# Patient Record
Sex: Female | Born: 1995 | Hispanic: Yes | Marital: Single | Smoking: Never smoker
Health system: Southern US, Community
[De-identification: ages and names within clinical notes are randomized; demographics above are authoritative.]

## PROBLEM LIST (undated history)

## (undated) ENCOUNTER — Inpatient Hospital Stay (HOSPITAL_COMMUNITY): Payer: Self-pay

## (undated) ENCOUNTER — Inpatient Hospital Stay: Payer: Self-pay

## (undated) DIAGNOSIS — A749 Chlamydial infection, unspecified: Secondary | ICD-10-CM

## (undated) DIAGNOSIS — Z789 Other specified health status: Secondary | ICD-10-CM

## (undated) DIAGNOSIS — A638 Other specified predominantly sexually transmitted diseases: Secondary | ICD-10-CM

## (undated) HISTORY — PX: NO PAST SURGERIES: SHX2092

## (undated) HISTORY — DX: Other specified health status: Z78.9

---
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
## 2016-01-02 ENCOUNTER — Encounter: Payer: Self-pay | Admitting: Family Medicine

## 2016-01-06 ENCOUNTER — Ambulatory Visit: Payer: Medicaid Other | Admitting: Family Medicine

## 2016-02-05 ENCOUNTER — Encounter: Payer: Self-pay | Admitting: Family Medicine

## 2016-02-11 ENCOUNTER — Encounter: Payer: Self-pay | Admitting: Family Medicine

## 2016-02-11 ENCOUNTER — Ambulatory Visit (INDEPENDENT_AMBULATORY_CARE_PROVIDER_SITE_OTHER): Payer: Medicaid Other | Admitting: Family Medicine

## 2016-02-11 VITALS — BP 118/70 | HR 68 | Temp 98.6°F | Ht 61.1 in | Wt 103.0 lb

## 2016-02-11 DIAGNOSIS — Z23 Encounter for immunization: Secondary | ICD-10-CM | POA: Diagnosis not present

## 2016-02-11 DIAGNOSIS — R300 Dysuria: Secondary | ICD-10-CM | POA: Diagnosis not present

## 2016-02-11 DIAGNOSIS — Z113 Encounter for screening for infections with a predominantly sexual mode of transmission: Secondary | ICD-10-CM | POA: Diagnosis not present

## 2016-02-11 DIAGNOSIS — N926 Irregular menstruation, unspecified: Secondary | ICD-10-CM

## 2016-02-11 DIAGNOSIS — N3001 Acute cystitis with hematuria: Secondary | ICD-10-CM | POA: Diagnosis not present

## 2016-02-11 MED ORDER — CIPROFLOXACIN HCL 500 MG PO TABS: 500.0000 mg | ORAL_TABLET | Freq: Two times a day (BID) | ORAL | Status: DC

## 2016-02-11 NOTE — Progress Notes (Signed)
BP 118/70 mmHg  Pulse 68  Temp(Src) 98.6 F (37 C)  Ht 5' 1.1" (1.552 m)  Wt 103 lb (46.72 kg)  BMI 19.40 kg/m2  SpO2 100%  LMP 01/18/2016 (Exact Date)  Breastfeeding? No   Subjective:    Patient ID: Carly Singleton, female    DOB: 01/04/1996, 20 y.o.   MRN: [id number]  HPI: Carly Singleton is a 20 y.o. female  Chief Complaint  Patient presents with  . vaginal issues    Patient states that she is having multiple symptoms, she is having symptoms of pregnany enough though she has the nexplanon, her period was two weeks last month, She is also having a burning sensation in the vaginal area.   Concerned that she is having pregnancy symptoms vs nexaplanon side effects. Has had the nexplanon for about a month now.  Period lasted 2 weeks  Has been hungry at times Has not been hungry at times Has been having pain in her back and her hip Nauseous Would like pregnancy test  URINARY SYMPTOMS Duration: 2 weeks Dysuria: burning Urinary frequency: yes Urgency: yes Small volume voids: no Symptom severity: mild Urinary incontinence: no Foul odor: no Hematuria: no Abdominal pain: no Back pain: yes Suprapubic pain/pressure: no Flank pain: yes Fever:  no Vomiting: no Status: worse Previous urinary tract infection: yes Recurrent urinary tract infection: no Sexual activity: monogamous History of sexually transmitted disease: no Vaginal discharge: no Treatments attempted: none   Relevant past medical, surgical, family and social history reviewed and updated as indicated. Interim medical history since our last visit reviewed. Allergies and medications reviewed and updated.  Review of Systems  Constitutional: Negative.   Respiratory: Negative.   Cardiovascular: Negative.   Gastrointestinal: Negative.   Genitourinary: Positive for dysuria, urgency, frequency and flank pain. Negative for hematuria, decreased urine volume, vaginal bleeding, vaginal discharge, enuresis,  difficulty urinating, genital sores, vaginal pain, menstrual problem, pelvic pain and dyspareunia.  Psychiatric/Behavioral: Negative.      Per HPI unless specifically indicated above     Objective:    BP 118/70 mmHg  Pulse 68  Temp(Src) 98.6 F (37 C)  Ht 5' 1.1" (1.552 m)  Wt 103 lb (46.72 kg)  BMI 19.40 kg/m2  SpO2 100%  LMP 01/18/2016 (Exact Date)  Breastfeeding? No  Wt Readings from Last 3 Encounters:  02/11/16 103 lb (46.72 kg) (8 %*, Z = -1.39)  12/31/15 101 lb 8 oz (46.04 kg) (7 %*, Z = -1.51)  12/26/15 99 lb 9.6 oz (45.178 kg) (5 %*, Z = -1.68)   * Growth percentiles are based on CDC 2-20 Years data.    Physical Exam  Constitutional: She is oriented to person, place, and time. She appears well-developed and well-nourished. No distress.  HENT:  Head: Normocephalic and atraumatic.  Right Ear: Hearing normal.  Left Ear: Hearing normal.  Nose: Nose normal.  Eyes: Conjunctivae and lids are normal. Right eye exhibits no discharge. Left eye exhibits no discharge. No scleral icterus.  Cardiovascular: Normal rate, regular rhythm, normal heart sounds and intact distal pulses.  Exam reveals no gallop and no friction rub.   No murmur heard. Pulmonary/Chest: Effort normal and breath sounds normal. No respiratory distress. She has no wheezes. She has no rales. She exhibits no tenderness.  Abdominal: Soft. Bowel sounds are normal. She exhibits no distension and no mass. There is no tenderness. There is CVA tenderness. There is no rebound and no guarding.  Musculoskeletal: Normal range of motion.  Neurological: She  is alert and oriented to person, place, and time.  Skin: Skin is warm, dry and intact. No rash noted. No erythema. No pallor.  Psychiatric: She has a normal mood and affect. Her speech is normal and behavior is normal. Judgment and thought content normal. Cognition and memory are normal.  Nursing note and vitals reviewed.   Results for orders placed or performed in  visit on 12/31/15  POCT urine pregnancy  Result Value Ref Range   Preg Test, Ur Negative Negative      Assessment & Plan:   Problem List Items Addressed This Visit    None    Visit Diagnoses    Acute cystitis with hematuria    -  Primary    Will start cipro. Possibly causing all her symptoms. Not breastfeeding. Call with any concerns or complaints.     Abnormal menstrual periods        Likely due to nexplanon- urine pregnancy negative.     Relevant Orders    UA/M w/rflx Culture, Routine    Pregnancy, urine    Dysuria        UA checked today, + UTI    Relevant Orders    UA/M w/rflx Culture, Routine    Pregnancy, urine    Routine screening for STI (sexually transmitted infection)        Labs checked today. Await results.     Relevant Orders    HIV antibody    Immunization due        Meningitis shot given today.    Relevant Orders    Meningococcal polysaccharide vaccine subcutaneous        Follow up plan: Return After 02/29/16 for physical.

## 2016-02-11 NOTE — Patient Instructions (Signed)

## 2016-02-12 ENCOUNTER — Encounter: Payer: Self-pay | Admitting: Family Medicine

## 2016-02-12 LAB — HIV ANTIBODY (ROUTINE TESTING W REFLEX): HIV Screen 4th Generation wRfx: NONREACTIVE

## 2016-02-13 LAB — MICROSCOPIC EXAMINATION

## 2016-02-13 LAB — UA/M W/RFLX CULTURE, ROUTINE
BILIRUBIN UA: NEGATIVE
GLUCOSE, UA: NEGATIVE
KETONES UA: NEGATIVE
Nitrite, UA: NEGATIVE
Protein, UA: NEGATIVE
SPEC GRAV UA: 1.015 (ref 1.005–1.030)
UUROB: 0.2 mg/dL (ref 0.2–1.0)
pH, UA: 5 (ref 5.0–7.5)

## 2016-02-13 LAB — PREGNANCY, URINE: PREG TEST UR: NEGATIVE

## 2016-02-13 LAB — URINE CULTURE, REFLEX

## 2016-03-02 ENCOUNTER — Encounter: Payer: Medicaid Other | Admitting: Family Medicine

## 2016-03-02 ENCOUNTER — Encounter: Payer: Self-pay | Admitting: Family Medicine

## 2016-03-02 NOTE — Progress Notes (Signed)
History and recommendations reviewed, agree with assessment and plan as outlined  M.Raechel Marcos. MD 

## 2016-04-14 ENCOUNTER — Ambulatory Visit (INDEPENDENT_AMBULATORY_CARE_PROVIDER_SITE_OTHER): Payer: Medicaid Other | Admitting: Family Medicine

## 2016-04-14 ENCOUNTER — Encounter: Payer: Self-pay | Admitting: Family Medicine

## 2016-04-14 VITALS — BP 92/49 | HR 73 | Temp 98.7°F | Ht 61.0 in | Wt 102.0 lb

## 2016-04-14 DIAGNOSIS — M545 Low back pain: Secondary | ICD-10-CM

## 2016-04-14 MED ORDER — CIPROFLOXACIN HCL 250 MG PO TABS: 250.0000 mg | ORAL_TABLET | Freq: Two times a day (BID) | ORAL | Status: DC

## 2016-04-14 NOTE — Progress Notes (Signed)
BP 92/49 mmHg  Pulse 73  Temp(Src) 98.7 F (37.1 C)  Ht 5\' 1"  (1.549 m)  Wt 102 lb (46.267 kg)  BMI 19.28 kg/m2  SpO2 100%  LMP 03/23/2016 (Approximate)  Breastfeeding? Unknown   Subjective:    Patient ID: Carly Singleton, female    DOB: January 09, 1996, 20 y.o.   MRN: [id number]  HPI: Carly Singleton is a 20 y.o. female  Chief Complaint  Patient presents with  . Back Pain    Patient did not take her antibiotics that were given for a UTI   Patient was here with UTI 2 months ago. Mixed flora on her culture. Did not take the antibiotics for the UTI after last visit. Here today with back pain.  Back has been hurting for about a month. Low back, both BACK PAIN Duration: 1 month Mechanism of injury: unknown Location: bilateral and low back Onset: gradual Severity: moderate Quality: sharp, dull, aching and cramping Frequency: intermittent Radiation: none Aggravating factors: none Alleviating factors: nothing Status: stable Treatments attempted: rest, ice, heat, APAP, ibuprofen and aleve  Relief with NSAIDs?: mild Nighttime pain:  no Paresthesias / decreased sensation:  no Bowel / bladder incontinence:  no Fevers:  no Dysuria / urinary frequency:  no  Relevant past medical, surgical, family and social history reviewed and updated as indicated. Interim medical history since our last visit reviewed. Allergies and medications reviewed and updated.  Review of Systems  Constitutional: Negative.   Respiratory: Negative.   Cardiovascular: Negative.   Gastrointestinal: Negative.   Musculoskeletal: Positive for back pain. Negative for myalgias, joint swelling, arthralgias, gait problem, neck pain and neck stiffness.  Skin: Negative.   Psychiatric/Behavioral: Negative.     Per HPI unless specifically indicated above     Objective:    BP 92/49 mmHg  Pulse 73  Temp(Src) 98.7 F (37.1 C)  Ht 5\' 1"  (1.549 m)  Wt 102 lb (46.267 kg)  BMI 19.28 kg/m2  SpO2 100%   LMP 03/23/2016 (Approximate)  Breastfeeding? Unknown  Wt Readings from Last 3 Encounters:  04/14/16 102 lb (46.267 kg) (7 %*, Z = -1.50)  02/11/16 103 lb (46.72 kg) (8 %*, Z = -1.39)  12/31/15 101 lb 8 oz (46.04 kg) (7 %*, Z = -1.51)   * Growth percentiles are based on CDC 2-20 Years data.    Physical Exam  Constitutional: She is oriented to person, place, and time. She appears well-developed and well-nourished. No distress.  HENT:  Head: Normocephalic and atraumatic.  Right Ear: Hearing normal.  Left Ear: Hearing normal.  Nose: Nose normal.  Eyes: Conjunctivae and lids are normal. Right eye exhibits no discharge. Left eye exhibits no discharge. No scleral icterus.  Cardiovascular: Normal rate, regular rhythm, normal heart sounds and intact distal pulses.  Exam reveals no gallop and no friction rub.   No murmur heard. Pulmonary/Chest: Effort normal and breath sounds normal. No respiratory distress. She has no wheezes. She has no rales. She exhibits no tenderness.  Neurological: She is alert and oriented to person, place, and time.  Skin: Skin is warm, dry and intact. No rash noted. No erythema. No pallor.  Psychiatric: She has a normal mood and affect. Her speech is normal and behavior is normal. Judgment and thought content normal. Cognition and memory are normal.  Nursing note and vitals reviewed. Back Exam:    Inspection:  Normal spinal curvature.  No deformity, ecchymosis, erythema, or lesions     Palpation:     Midline spinal  tenderness: no      Paralumbar tenderness: no      Parathoracic tenderness: no      Buttocks tenderness: no     Range of Motion:      Flexion: Normal     Extension:Normal     Lateral bending:Normal    Rotation:Normal    Neuro Exam:Lower extremity DTRs normal & symmetric.  Strength and sensation intact.    Special Tests:      Straight leg raise:negative  Results for orders placed or performed in visit on 02/11/16  Microscopic Examination  Result  Value Ref Range   WBC, UA 11-30 (A) 0 -  5 /hpf   RBC, UA 0-2 0 -  2 /hpf   Epithelial Cells (non renal) >10 (A) 0 - 10 /hpf   Bacteria, UA Few None seen/Few  HIV antibody  Result Value Ref Range   HIV Screen 4th Generation wRfx Non Reactive Non Reactive  UA/M w/rflx Culture, Routine  Result Value Ref Range   Specific Gravity, UA 1.015 1.005 - 1.030   pH, UA 5.0 5.0 - 7.5   Color, UA Yellow Yellow   Appearance Ur Cloudy (A) Clear   Leukocytes, UA 2+ (A) Negative   Protein, UA Negative Negative/Trace   Glucose, UA Negative Negative   Ketones, UA Negative Negative   RBC, UA Trace (A) Negative   Bilirubin, UA Negative Negative   Urobilinogen, Ur 0.2 0.2 - 1.0 mg/dL   Nitrite, UA Negative Negative   Microscopic Examination See below:    Urinalysis Reflex Comment   Pregnancy, urine  Result Value Ref Range   Preg Test, Ur Negative Negative  Urine Culture, Routine  Result Value Ref Range   Urine Culture, Routine Final report    Urine Culture result 1 Comment       Assessment & Plan:   Problem List Items Addressed This Visit    None    Visit Diagnoses    Low back pain without sciatica, unspecified back pain laterality    -  Primary    Appears to have UTI. Will treat. Will call her on Friday to see if better, if not consider muscular cause.    Relevant Orders    UA/M w/rflx Culture, Routine        Follow up plan: Return if symptoms worsen or fail to improve.

## 2016-04-16 LAB — MICROSCOPIC EXAMINATION: Epithelial Cells (non renal): >10 /hpf — AB (ref 0–10)

## 2016-04-16 LAB — UA/M W/RFLX CULTURE, ROUTINE
Bilirubin, UA: NEGATIVE
Glucose, UA: NEGATIVE
Ketones, UA: NEGATIVE
NITRITE UA: NEGATIVE
PH UA: 5.5 (ref 5.0–7.5)
Protein, UA: NEGATIVE
Specific Gravity, UA: >1.03 — ABNORMAL HIGH (ref 1.005–1.030)
UUROB: 0.2 mg/dL (ref 0.2–1.0)

## 2016-04-16 LAB — URINE CULTURE, REFLEX

## 2016-04-20 ENCOUNTER — Telehealth: Payer: Self-pay | Admitting: Family Medicine

## 2016-04-20 NOTE — Telephone Encounter (Signed)
Called to check in on Yesinia's back. She was not home. Mom will have her call when she gets home.

## 2016-04-20 NOTE — Telephone Encounter (Signed)
-----   Message from Dorcas CarrowMegan P Johnson, DO sent at 04/14/2016  2:25 PM EDT ----- Call Carly CampusYesenia about her back

## 2016-05-01 ENCOUNTER — Encounter: Payer: Self-pay | Admitting: Family Medicine

## 2016-05-01 NOTE — Telephone Encounter (Signed)
Unable to get in touch by phone. Letter generated and mailed to patient.

## 2016-08-10 ENCOUNTER — Emergency Department
Admission: EM | Admit: 2016-08-10 | Discharge: 2016-08-10 | Disposition: A | Discharge status: Home / Self Care | Payer: No Typology Code available for payment source | Attending: Student in an Organized Health Care Education/Training Program | Admitting: Student in an Organized Health Care Education/Training Program

## 2016-08-10 ENCOUNTER — Emergency Department: Payer: No Typology Code available for payment source

## 2016-08-10 ENCOUNTER — Encounter: Payer: Self-pay | Admitting: Medical Oncology

## 2016-08-10 DIAGNOSIS — Y999 Unspecified external cause status: Secondary | ICD-10-CM | POA: Diagnosis not present

## 2016-08-10 DIAGNOSIS — Y92410 Unspecified street and highway as the place of occurrence of the external cause: Secondary | ICD-10-CM | POA: Insufficient documentation

## 2016-08-10 DIAGNOSIS — Y9389 Activity, other specified: Secondary | ICD-10-CM | POA: Insufficient documentation

## 2016-08-10 DIAGNOSIS — M545 Low back pain, unspecified: Secondary | ICD-10-CM

## 2016-08-10 LAB — POCT PREGNANCY, URINE: Preg Test, Ur: NEGATIVE

## 2016-08-10 MED ORDER — BACLOFEN 10 MG PO TABS: 10.0000 mg | ORAL_TABLET | Freq: Three times a day (TID) | ORAL | 0 refills | Status: DC

## 2016-08-10 MED ORDER — NAPROXEN 500 MG PO TABS: 500.0000 mg | ORAL_TABLET | Freq: Two times a day (BID) | ORAL | 0 refills | Status: DC

## 2016-08-10 NOTE — Discharge Instructions (Signed)
Follow-up with your primary care provider for symptoms that are not improving over the week. Return to emergency department for symptoms that change or worsen if unable to schedule an appointment.
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
## 2016-10-06 ENCOUNTER — Ambulatory Visit: Payer: Medicaid Other | Admitting: Family Medicine

## 2016-10-07 ENCOUNTER — Other Ambulatory Visit: Payer: Self-pay | Admitting: Family Medicine

## 2016-10-07 ENCOUNTER — Ambulatory Visit: Payer: Medicaid Other | Admitting: Family Medicine

## 2016-10-07 DIAGNOSIS — Z113 Encounter for screening for infections with a predominantly sexual mode of transmission: Secondary | ICD-10-CM

## 2016-10-12 ENCOUNTER — Ambulatory Visit: Payer: Medicaid Other | Admitting: Family Medicine

## 2016-10-23 ENCOUNTER — Ambulatory Visit (INDEPENDENT_AMBULATORY_CARE_PROVIDER_SITE_OTHER): Payer: Medicaid Other | Admitting: Family Medicine

## 2016-10-23 ENCOUNTER — Encounter: Payer: Self-pay | Admitting: Family Medicine

## 2016-10-23 VITALS — BP 97/66 | HR 85 | Temp 98.5°F | Wt 99.0 lb

## 2016-10-23 DIAGNOSIS — F321 Major depressive disorder, single episode, moderate: Secondary | ICD-10-CM | POA: Diagnosis not present

## 2016-10-23 DIAGNOSIS — F331 Major depressive disorder, recurrent, moderate: Secondary | ICD-10-CM | POA: Insufficient documentation

## 2016-10-23 DIAGNOSIS — R634 Abnormal weight loss: Secondary | ICD-10-CM

## 2016-10-23 DIAGNOSIS — H538 Other visual disturbances: Secondary | ICD-10-CM | POA: Diagnosis not present

## 2016-10-23 DIAGNOSIS — Z23 Encounter for immunization: Secondary | ICD-10-CM | POA: Diagnosis not present

## 2016-10-23 HISTORY — DX: Major depressive disorder, recurrent, moderate: F33.1

## 2016-10-23 MED ORDER — SERTRALINE HCL 25 MG PO TABS: 25.0000 mg | ORAL_TABLET | Freq: Every day | ORAL | 2 refills | Status: DC

## 2016-10-23 NOTE — Assessment & Plan Note (Signed)
Will start her on zoloft. Risks and benefits discussed. Call with any concerns. Recheck 1 month.

## 2016-10-23 NOTE — Patient Instructions (Addendum)
[employer] instant [employer]  Influenza (Flu) Vaccine (Inactivated or Recombinant):  1. Why get vaccinated? Influenza ("flu") is a contagious disease that spreads around the [place] every year, usually between October and May. Flu is caused by influenza viruses, and is spread mainly by coughing, sneezing, and close contact. Anyone can get flu. Flu strikes suddenly and can last several days. Symptoms vary by age, but can include:  fever/chills  sore throat  muscle aches  fatigue  cough  headache  runny or stuffy nose Flu can also lead to pneumonia and blood infections, and cause diarrhea and seizures in children. If you have a medical condition, such as heart or lung disease, flu can make it worse. Flu is more dangerous for some people. Infants and young children, people 67 years of age and older, pregnant women, and people with certain health conditions or a weakened immune system are at greatest risk. Each year thousands of people in the [place] die from flu, and many more are hospitalized. Flu vaccine can:  keep you from getting flu,  make flu less severe if you do get it, and  keep you from spreading flu to your family and other people. 2. Inactivated and recombinant flu vaccines A dose of flu vaccine is recommended every flu season. Children 6 months through 57 years of age may need two doses during the same flu season. Everyone else needs only one dose each flu season. Some inactivated flu vaccines contain a very small amount of a mercury-based preservative called thimerosal. Studies have not shown thimerosal in vaccines to be harmful, but flu vaccines that do not contain thimerosal are available. There is no live flu virus in flu shots. They cannot cause the flu. There are many flu viruses, and they are always changing. Each year a new flu vaccine is made to protect against three or four viruses that are likely to cause disease in the upcoming flu season. But even  when the vaccine doesn't exactly match these viruses, it may still provide some protection. Flu vaccine cannot prevent:  flu that is caused by a virus not covered by the vaccine, or  illnesses that look like flu but are not. It takes about 2 weeks for protection to develop after vaccination, and protection lasts through the flu season. 3. Some people should not get this vaccine Tell the person who is giving you the vaccine:  If you have any severe, life-threatening allergies. If you ever had a life-threatening allergic reaction after a dose of flu vaccine, or have a severe allergy to any part of this vaccine, you may be advised not to get vaccinated. Most, but not all, types of flu vaccine contain a small amount of egg protein.  If you ever had Guillain-Barre Syndrome (also called GBS). Some people with a history of GBS should not get this vaccine. This should be discussed with your doctor.  If you are not feeling well. It is usually okay to get flu vaccine when you have a mild illness, but you might be asked to come back when you feel better. 4. Risks of a vaccine reaction With any medicine, including vaccines, there is a chance of reactions. These are usually mild and go away on their own, but serious reactions are also possible. Most people who get a flu shot do not have any problems with it. Minor problems following a flu shot include:  soreness, redness, or swelling where the shot was given  hoarseness  sore, red or itchy  eyes  cough  fever  aches  headache  itching  fatigue If these problems occur, they usually begin soon after the shot and last 1 or 2 days. More serious problems following a flu shot can include the following:  There may be a small increased risk of Guillain-Barre Syndrome (GBS) after inactivated flu vaccine. This risk has been estimated at 1 or 2 additional cases per million people vaccinated. This is much lower than the risk of severe complications  from flu, which can be prevented by flu vaccine.  Young children who get the flu shot along with pneumococcal vaccine (PCV13) and/or DTaP vaccine at the same time might be slightly more likely to have a seizure caused by fever. Ask your doctor for more information. Tell your doctor if a child who is getting flu vaccine has ever had a seizure. Problems that could happen after any injected vaccine:  People sometimes faint after a medical procedure, including vaccination. Sitting or lying down for about 15 minutes can help prevent fainting, and injuries caused by a fall. Tell your doctor if you feel dizzy, or have vision changes or ringing in the ears.  Some people get severe pain in the shoulder and have difficulty moving the arm where a shot was given. This happens very rarely.  Any medication can cause a severe allergic reaction. Such reactions from a vaccine are very rare, estimated at about 1 in a million doses, and would happen within a few minutes to a few hours after the vaccination. As with any medicine, there is a very remote chance of a vaccine causing a serious injury or death. The safety of vaccines is always being monitored. For more information, visit: http://www.aguilar.org/ 5. What if there is a serious reaction? What should I look for?  Look for anything that concerns you, such as signs of a severe allergic reaction, very high fever, or unusual behavior. Signs of a severe allergic reaction can include hives, swelling of the face and throat, difficulty breathing, a fast heartbeat, dizziness, and weakness. These would start a few minutes to a few hours after the vaccination. What should I do?  If you think it is a severe allergic reaction or other emergency that can't wait, call 9-1-1 and get the person to the nearest hospital. Otherwise, call your doctor.  Reactions should be reported to the Vaccine Adverse Event Reporting System (VAERS). Your doctor should file this report, or  you can do it yourself through the VAERS web site at www.vaers.SamedayNews.es, or by calling 850-339-5350. VAERS does not give medical advice. 6. The National Vaccine Injury Compensation Program The [employer] Vaccine Injury Compensation Program (VICP) is a federal program that was created to compensate people who may have been injured by certain vaccines. Persons who believe they may have been injured by a vaccine can learn about the program and about filing a claim by calling 702-402-8185 or visiting the [employer] website at GoldCloset.com.ee. There is a time limit to file a claim for compensation. 7. How can I learn more?  Ask your healthcare provider. He or she can give you the vaccine package insert or suggest other sources of information.  Call your local or state health department.  Contact the Centers for Disease Control and Prevention (CDC):  Call 406-232-6490 (1-800-CDC-INFO) or  Visit CDC's website at https://gibson.com/ Vaccine Information Statement Inactivated Influenza Vaccine (08/03/2014)   This information is not intended to replace advice given to you by your health care provider. Make sure you discuss any  questions you have with your health care provider.   Document Released: 10/08/2006 Document Revised: 01/04/2015 Document Reviewed: 08/06/2014 Elsevier Interactive Patient Education 2016 [place] and Stress Management Stress is a normal reaction to life events. It is what you feel when life demands more than you are used to or more than you can handle. Some stress can be useful. For example, the stress reaction can help you catch the last bus of the day, study for a test, or meet a deadline at work. But stress that occurs too often or for too long can cause problems. It can affect your emotional health and interfere with relationships and normal daily activities. Too much stress can weaken your immune system and increase your risk for physical illness. If you  already have a medical problem, stress can make it worse. CAUSES  All sorts of life events may cause stress. An event that causes stress for one person may not be stressful for another person. Major life events commonly cause stress. These may be positive or negative. Examples include losing your job, moving into a new home, getting married, having a baby, or losing a loved one. Less obvious life events may also cause stress, especially if they occur day after day or in combination. Examples include working long hours, driving in traffic, caring for children, being in debt, or being in a difficult relationship. SIGNS AND SYMPTOMS Stress may cause emotional symptoms including, the following:  Anxiety. This is feeling worried, afraid, on edge, overwhelmed, or out of control.  Anger. This is feeling irritated or impatient.  Depression. This is feeling sad, down, helpless, or guilty.  Difficulty focusing, remembering, or making decisions. Stress may cause physical symptoms, including the following:   Aches and pains. These may affect your head, neck, back, stomach, or other areas of your body.  Tight muscles or clenched jaw.  Low energy or trouble sleeping. Stress may cause unhealthy behaviors, including the following:   Eating to feel better (overeating) or skipping meals.  Sleeping too little, too much, or both.  Working too much or putting off tasks (procrastination).  Smoking, drinking alcohol, or using drugs to feel better. DIAGNOSIS  Stress is diagnosed through an assessment by your health care provider. Your health care provider will ask questions about your symptoms and any stressful life events.Your health care provider will also ask about your medical history and may order blood tests or other tests. Certain medical conditions and medicine can cause physical symptoms similar to stress. Mental illness can cause emotional symptoms and unhealthy behaviors similar to stress. Your  health care provider may refer you to a mental health professional for further evaluation.  TREATMENT  Stress management is the recommended treatment for stress.The goals of stress management are reducing stressful life events and coping with stress in healthy ways.  Techniques for reducing stressful life events include the following:  Stress identification. Self-monitor for stress and identify what causes stress for you. These skills may help you to avoid some stressful events.  Time management. Set your priorities, keep a calendar of events, and learn to say "no." These tools can help you avoid making too many commitments. Techniques for coping with stress include the following:  Rethinking the problem. Try to think realistically about stressful events rather than ignoring them or overreacting. Try to find the positives in a stressful situation rather than focusing on the negatives.  Exercise. Physical exercise can release both physical and emotional tension. The key is to find  a form of exercise you enjoy and do it regularly.  Relaxation techniques. These relax the body and mind. Examples include yoga, meditation, tai chi, biofeedback, deep breathing, progressive muscle relaxation, listening to music, being out in nature, journaling, and other hobbies. Again, the key is to find one or more that you enjoy and can do regularly.  Healthy lifestyle. Eat a balanced diet, get plenty of sleep, and do not smoke. Avoid using alcohol or drugs to relax.  Strong support network. Spend time with family, friends, or other people you enjoy being around.Express your feelings and talk things over with someone you trust. Counseling or talktherapy with a mental health professional may be helpful if you are having difficulty managing stress on your own. Medicine is typically not recommended for the treatment of stress.Talk to your health care provider if you think you need medicine for symptoms of  stress. HOME CARE INSTRUCTIONS  Keep all follow-up visits as directed by your health care provider.  Take all medicines as directed by your health care provider. SEEK MEDICAL CARE IF:  Your symptoms get worse or you start having new symptoms.  You feel overwhelmed by your problems and can no longer manage them on your own. SEEK IMMEDIATE MEDICAL CARE IF:  You feel like hurting yourself or someone else.   This information is not intended to replace advice given to you by your health care provider. Make sure you discuss any questions you have with your health care provider.   Document Released: 06/09/2001 Document Revised: 01/04/2015 Document Reviewed: 08/08/2013 Elsevier Interactive Patient Education [employer].
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
## 2016-10-24 LAB — CBC WITH DIFFERENTIAL/PLATELET
BASOS ABS: 0 10*3/uL (ref 0.0–0.2)
Basos: 0 %
EOS (ABSOLUTE): 0.2 10*3/uL (ref 0.0–0.4)
EOS: 2 %
HEMATOCRIT: 37.8 % (ref 34.0–46.6)
Hemoglobin: 12.7 g/dL (ref 11.1–15.9)
IMMATURE GRANULOCYTES: 0 %
Immature Grans (Abs): 0 10*3/uL (ref 0.0–0.1)
LYMPHS ABS: 1.8 10*3/uL (ref 0.7–3.1)
Lymphs: 23 %
MCH: 28.3 pg (ref 26.6–33.0)
MCHC: 33.6 g/dL (ref 31.5–35.7)
MCV: 84 fL (ref 79–97)
MONOS ABS: 0.6 10*3/uL (ref 0.1–0.9)
Monocytes: 7 %
NEUTROS PCT: 68 %
Neutrophils Absolute: 5.4 10*3/uL (ref 1.4–7.0)
PLATELETS: 394 10*3/uL — AB (ref 150–379)
RBC: 4.49 x10E6/uL (ref 3.77–5.28)
RDW: 13.8 % (ref 12.3–15.4)
WBC: 8 10*3/uL (ref 3.4–10.8)

## 2016-10-24 LAB — HGB A1C W/O EAG: HEMOGLOBIN A1C: 5.4 % (ref 4.8–5.6)

## 2016-10-24 LAB — COMPREHENSIVE METABOLIC PANEL
ALK PHOS: 66 IU/L (ref 43–101)
ALT: 10 IU/L (ref 0–32)
AST: 17 IU/L (ref 0–40)
Albumin/Globulin Ratio: 1.3 (ref 1.2–2.2)
Albumin: 4.5 g/dL (ref 3.5–5.5)
BILIRUBIN TOTAL: 0.8 mg/dL (ref 0.0–1.2)
BUN / CREAT RATIO: 21 (ref 9–23)
BUN: 11 mg/dL (ref 6–20)
CHLORIDE: 103 mmol/L (ref 96–106)
CO2: 21 mmol/L (ref 18–29)
CREATININE: 0.53 mg/dL — AB (ref 0.57–1.00)
Calcium: 9.8 mg/dL (ref 8.7–10.2)
GFR calc Af Amer: 160 mL/min/{1.73_m2} (ref >59)
GFR calc non Af Amer: 139 mL/min/{1.73_m2} (ref >59)
GLUCOSE: 88 mg/dL (ref 65–99)
Globulin, Total: 3.4 g/dL (ref 1.5–4.5)
Potassium: 4.9 mmol/L (ref 3.5–5.2)
Sodium: 144 mmol/L (ref 134–144)
Total Protein: 7.9 g/dL (ref 6.0–8.5)

## 2016-10-24 LAB — TSH: TSH: 0.792 u[IU]/mL (ref 0.450–4.500)

## 2016-10-26 ENCOUNTER — Encounter: Payer: Self-pay | Admitting: Family Medicine

## 2016-11-23 ENCOUNTER — Ambulatory Visit (INDEPENDENT_AMBULATORY_CARE_PROVIDER_SITE_OTHER): Payer: Medicaid Other | Admitting: Family Medicine

## 2016-11-23 ENCOUNTER — Encounter: Payer: Self-pay | Admitting: Family Medicine

## 2016-11-23 VITALS — BP 91/58 | HR 65 | Temp 98.9°F | Wt 102.0 lb

## 2016-11-23 DIAGNOSIS — F321 Major depressive disorder, single episode, moderate: Secondary | ICD-10-CM

## 2016-11-23 MED ORDER — BUPROPION HCL ER (XL) 150 MG PO TB24: 150.0000 mg | ORAL_TABLET | Freq: Every day | ORAL | 0 refills | Status: DC

## 2016-11-23 NOTE — Progress Notes (Signed)
   BP (!) 91/58   Pulse 65   Temp 98.9 F (37.2 C)   Wt 102 lb (46.3 kg)   LMP 09/11/2016 (Approximate)   SpO2 100%   BMI 19.27 kg/m    Subjective:    Patient ID: Carly Singleton, female    DOB: 1996/10/09, 20 y.o.   MRN: [id number]  HPI: Carly Singleton is a 20 y.o. female  Chief Complaint  Patient presents with  . Depression    felt like it made her drowsey   Patient presents for 1 month medication follow up after starting Zoloft for new-onset depression. States she became very drowsy while taking the medication so has backed off of it and only takes it about 3 times per week now. Does not notice much benefit at all with the medication even when taking daily. Difficulty having the energy or motivation to leave home and get things done, says she just wants to be home by herself most times which isn't normal for her at all. Is very stressed with her many responsibilities. Has not tried going to a counselor yet. No SI/HI.   Relevant past medical, surgical, family and social history reviewed and updated as indicated. Interim medical history since our last visit reviewed. Allergies and medications reviewed and updated.  Review of Systems  Constitutional: Positive for fatigue.  HENT: Negative.   Respiratory: Negative.   Cardiovascular: Negative.   Gastrointestinal: Negative.   Genitourinary: Negative.   Musculoskeletal: Negative.   Neurological: Negative.   Psychiatric/Behavioral: Positive for dysphoric mood.    Per HPI unless specifically indicated above     Objective:    BP (!) 91/58   Pulse 65   Temp 98.9 F (37.2 C)   Wt 102 lb (46.3 kg)   LMP 09/11/2016 (Approximate)   SpO2 100%   BMI 19.27 kg/m   Wt Readings from Last 3 Encounters:  11/23/16 102 lb (46.3 kg) (6 %, Z= -1.58)*  10/23/16 99 lb (44.9 kg) (3 %, Z= -1.84)*  08/10/16 105 lb (47.6 kg) (10 %, Z= -1.29)*   * Growth percentiles are based on CDC 2-20 Years data.    Physical Exam    Constitutional: She is oriented to person, place, and time. She appears well-developed and well-nourished. No distress.  HENT:  Head: Atraumatic.  Eyes: Conjunctivae are normal. No scleral icterus.  Neck: Normal range of motion. Neck supple.  Cardiovascular: Normal rate.   Pulmonary/Chest: Effort normal. No respiratory distress.  Musculoskeletal: Normal range of motion.  Neurological: She is alert and oriented to person, place, and time.  Skin: Skin is warm and dry.  Psychiatric: She has a normal mood and affect. Her behavior is normal.  Nursing note and vitals reviewed.     Assessment & Plan:   Problem List Items Addressed This Visit      Other   Moderate single current episode of major depressive disorder (HCC) - Primary    Will switch to Wellbutrin, discussed possible risks and side effects in detail. Recommended adjunct counseling sessions.       Relevant Medications   buPROPion (WELLBUTRIN XL) 150 MG 24 hr tablet    Will titrate up slowly - 1 pill every other day x 1 week, then 1 pill daily    Follow up plan: Return in about 4 weeks (around 12/21/2016) for Medication/Depression.

## 2016-11-23 NOTE — Patient Instructions (Signed)
1 tablet every other day for 1 week Then 1 tablet daily for 1 week Follow up within this timeframe via Mychart or phone to let me know how you're doing and we can figure it out from there!

## 2016-11-23 NOTE — Assessment & Plan Note (Signed)
Will switch to Wellbutrin, discussed possible risks and side effects in detail. Recommended adjunct counseling sessions.

## 2016-12-24 ENCOUNTER — Ambulatory Visit (INDEPENDENT_AMBULATORY_CARE_PROVIDER_SITE_OTHER): Payer: Medicaid Other | Admitting: Family Medicine

## 2016-12-24 ENCOUNTER — Encounter: Payer: Self-pay | Admitting: Family Medicine

## 2016-12-24 VITALS — BP 95/61 | HR 58 | Temp 98.3°F | Wt 104.6 lb

## 2016-12-24 DIAGNOSIS — F321 Major depressive disorder, single episode, moderate: Secondary | ICD-10-CM

## 2016-12-24 MED ORDER — BUPROPION HCL ER (XL) 150 MG PO TB24: 150.0000 mg | ORAL_TABLET | Freq: Every day | ORAL | 1 refills | Status: DC

## 2016-12-24 NOTE — Progress Notes (Signed)
BP 95/61 (BP Location: Left Arm, Patient Position: Sitting, Cuff Size: Small)   Pulse (!) 58   Temp 98.3 F (36.8 C)   Wt 104 lb 9.6 oz (47.4 kg)   SpO2 98%   BMI 19.76 kg/m    Subjective:    Patient ID: Carly Singleton, female    DOB: 10-21-1996, 20 y.o.   MRN: [id number]  HPI: Carly Singleton is a 20 y.o. female  Chief Complaint  Patient presents with  . Depression   DEPRESSION Mood status: better Satisfied with current treatment?: yes Symptom severity: mild  Duration of current treatment : months Side effects: no Medication compliance: excellent compliance Psychotherapy/counseling: no  Depressed mood: no Anxious mood: no Anhedonia: no Significant weight loss or gain: no Insomnia: no  Fatigue: no Feelings of worthlessness or guilt: no Impaired concentration/indecisiveness: no Suicidal ideations: no Hopelessness: no Crying spells: no [facility] screen [facility] 12/24/2016 11/23/2016 10/23/2016  Decreased Interest 0 1 1  Down, Depressed, Hopeless 0 1 2  PHQ - 2 Score 0 2 3  Altered sleeping 0 0 1  Tired, decreased energy 0 3 2  Change in appetite 0 1 3  Feeling bad or failure about yourself  0 1 1  Trouble concentrating 0 0 1  Moving slowly or fidgety/restless 0 1 0  Suicidal thoughts 0 0 0  PHQ-9 Score 0 8 11   GAD 7 : Generalized Anxiety Score 12/24/2016 10/23/2016  Nervous, Anxious, on Edge 0 0  Control/stop worrying 0 1  Worry too much - different things 0 3  Trouble relaxing 0 1  Restless 0 0  Easily annoyed or irritable 1 0  Afraid - awful might happen 0 3  Total GAD 7 Score 1 8  Anxiety Difficulty - Somewhat difficult     Relevant past medical, surgical, family and social history reviewed and updated as indicated. Interim medical history since our last visit reviewed. Allergies and medications reviewed and updated.  Review of Systems  Constitutional: Negative.   Respiratory: Negative.   Cardiovascular: Negative.     Psychiatric/Behavioral: Negative.     Per HPI unless specifically indicated above     Objective:    BP 95/61 (BP Location: Left Arm, Patient Position: Sitting, Cuff Size: Small)   Pulse (!) 58   Temp 98.3 F (36.8 C)   Wt 104 lb 9.6 oz (47.4 kg)   SpO2 98%   BMI 19.76 kg/m   Wt Readings from Last 3 Encounters:  12/24/16 104 lb 9.6 oz (47.4 kg) (9 %, Z= -1.37)*  11/23/16 102 lb (46.3 kg) (6 %, Z= -1.58)*  10/23/16 99 lb (44.9 kg) (3 %, Z= -1.84)*   * Growth percentiles are based on CDC 2-20 Years data.    Physical Exam  Constitutional: She is oriented to person, place, and time. She appears well-developed and well-nourished. No distress.  HENT:  Head: Normocephalic and atraumatic.  Right Ear: Hearing normal.  Left Ear: Hearing normal.  Nose: Nose normal.  Eyes: Conjunctivae and lids are normal. Right eye exhibits no discharge. Left eye exhibits no discharge. No scleral icterus.  Pulmonary/Chest: Effort normal. No respiratory distress.  Musculoskeletal: Normal range of motion.  Neurological: She is alert and oriented to person, place, and time.  Skin: Skin is warm, dry and intact. No rash noted. No erythema. No pallor.  Psychiatric: She has a normal mood and affect. Her speech is normal and behavior is normal. Judgment and thought content normal. Cognition and memory are  normal.  Nursing note and vitals reviewed.   Results for orders placed or performed in visit on 10/23/16  CBC with Differential/Platelet  Result Value Ref Range   WBC 8.0 3.4 - 10.8 x10E3/uL   RBC 4.49 3.77 - 5.28 x10E6/uL   Hemoglobin 12.7 11.1 - 15.9 g/dL   Hematocrit 16.137.8 09.634.0 - 46.6 %   MCV 84 79 - 97 fL   MCH 28.3 26.6 - 33.0 pg   MCHC 33.6 31.5 - 35.7 g/dL   RDW 04.513.8 40.912.3 - 81.115.4 %   Platelets 394 (H) 150 - 379 x10E3/uL   Neutrophils 68 Not Estab. %   Lymphs 23 Not Estab. %   Monocytes 7 Not Estab. %   Eos 2 Not Estab. %   Basos 0 Not Estab. %   Neutrophils Absolute 5.4 1.4 - 7.0 x10E3/uL    Lymphocytes Absolute 1.8 0.7 - 3.1 x10E3/uL   Monocytes Absolute 0.6 0.1 - 0.9 x10E3/uL   EOS (ABSOLUTE) 0.2 0.0 - 0.4 x10E3/uL   Basophils Absolute 0.0 0.0 - 0.2 x10E3/uL   Immature Granulocytes 0 Not Estab. %   Immature Grans (Abs) 0.0 0.0 - 0.1 x10E3/uL  Hgb A1c w/o eAG  Result Value Ref Range   Hgb A1c MFr Bld 5.4 4.8 - 5.6 %  Comprehensive metabolic panel  Result Value Ref Range   Glucose 88 65 - 99 mg/dL   BUN 11 6 - 20 mg/dL   Creatinine, Ser 9.140.53 (L) 0.57 - 1.00 mg/dL   GFR calc non Af Amer 139 >59 mL/min/1.73   GFR calc Af Amer 160 >59 mL/min/1.73   BUN/Creatinine Ratio 21 9 - 23   Sodium 144 134 - 144 mmol/L   Potassium 4.9 3.5 - 5.2 mmol/L   Chloride 103 96 - 106 mmol/L   CO2 21 18 - 29 mmol/L   Calcium 9.8 8.7 - 10.2 mg/dL   Total Protein 7.9 6.0 - 8.5 g/dL   Albumin 4.5 3.5 - 5.5 g/dL   Globulin, Total 3.4 1.5 - 4.5 g/dL   Albumin/Globulin Ratio 1.3 1.2 - 2.2   Bilirubin Total 0.8 0.0 - 1.2 mg/dL   Alkaline Phosphatase 66 43 - 101 IU/L   AST 17 0 - 40 IU/L   ALT 10 0 - 32 IU/L  TSH  Result Value Ref Range   TSH 0.792 0.450 - 4.500 uIU/mL      Assessment & Plan:   Problem List Items Addressed This Visit      Other   Moderate single current episode of major depressive disorder (HCC)    Under good control. Continue current regimen. Continue to monitor. Recheck 6 months.      Relevant Medications   buPROPion (WELLBUTRIN XL) 150 MG 24 hr tablet       Follow up plan: Return in about 6 months (around 06/24/2017) for Physical.

## 2016-12-24 NOTE — Assessment & Plan Note (Signed)
Under good control. Continue current regimen. Continue to monitor. Recheck 6 months.  

## 2017-01-22 ENCOUNTER — Telehealth: Payer: Self-pay | Admitting: Family Medicine

## 2017-01-22 NOTE — Telephone Encounter (Addendum)
Patient called and needs to be seen for pelvic pain with some type of sore in the lower pelvic area.  I spoke with Tiffany regarding these issues and she gave me information to call the patient back with, I could not reach the patient and she does not have a VM set up.  Clydie BraunKaren

## 2017-01-22 NOTE — Telephone Encounter (Signed)
Tried to reach patient to give her information that I had spoke with Tiffany regarding an earlier call from the patient.  I was unable to reach the patient as she had no VM set up

## 2017-01-22 NOTE — Telephone Encounter (Signed)
Patient called earlier today stating she was having pelvic pain

## 2017-02-24 ENCOUNTER — Encounter: Payer: Self-pay | Admitting: Family Medicine

## 2017-02-24 ENCOUNTER — Ambulatory Visit (INDEPENDENT_AMBULATORY_CARE_PROVIDER_SITE_OTHER): Payer: Self-pay | Admitting: Family Medicine

## 2017-02-24 VITALS — BP 93/59 | HR 60 | Temp 98.4°F | Resp 17 | Ht 61.02 in | Wt 107.0 lb

## 2017-02-24 DIAGNOSIS — N3001 Acute cystitis with hematuria: Secondary | ICD-10-CM

## 2017-02-24 DIAGNOSIS — R300 Dysuria: Secondary | ICD-10-CM

## 2017-02-24 LAB — UA/M W/RFLX CULTURE, ROUTINE
Bilirubin, UA: NEGATIVE
GLUCOSE, UA: NEGATIVE
KETONES UA: NEGATIVE
NITRITE UA: NEGATIVE
SPEC GRAV UA: 1.025 (ref 1.005–1.030)
Urobilinogen, Ur: 0.2 mg/dL (ref 0.2–1.0)
pH, UA: 5 (ref 5.0–7.5)

## 2017-02-24 LAB — MICROSCOPIC EXAMINATION: RBC, UA: NONE SEEN /hpf (ref 0–?)

## 2017-02-24 MED ORDER — CIPROFLOXACIN HCL 500 MG PO TABS: 500.0000 mg | ORAL_TABLET | Freq: Two times a day (BID) | ORAL | 0 refills | Status: DC

## 2017-02-24 NOTE — Progress Notes (Signed)
BP (!) 93/59 (BP Location: Left Arm, Patient Position: Sitting, Cuff Size: Normal)   Pulse 60   Temp 98.4 F (36.9 C) (Oral)   Resp 17   Ht 5' 1.02" (1.55 m)   Wt 107 lb (48.5 kg)   LMP 09/13/2016   SpO2 100%   BMI 20.20 kg/m    Subjective:    Patient ID: Carly Singleton, female    DOB: 1996/06/09, 21 y.o.   MRN: [id number]  HPI: Carly Singleton is a 21 y.o. female  Chief Complaint  Patient presents with  . Dysuria    onset 3 weeks   URINARY SYMPTOMS Duration: 3 weeks Dysuria: yes Urinary frequency: no Urgency: no Small volume voids: no Symptom severity: moderate Urinary incontinence: no Foul odor: no Hematuria: no Abdominal pain: yes Back pain: yes Suprapubic pain/pressure: yes Flank pain: yes Fever:  no Vomiting: no Relief with cranberry juice: no Relief with pyridium: no Status: worse Previous urinary tract infection: no Recurrent urinary tract infection: no Sexual activity: monogomous History of sexually transmitted disease: no Vaginal discharge: no Treatments attempted: increasing fluids    Relevant past medical, surgical, family and social history reviewed and updated as indicated. Interim medical history since our last visit reviewed. Allergies and medications reviewed and updated.  Review of Systems  Constitutional: Negative.   Respiratory: Negative.   Cardiovascular: Negative.   Genitourinary: Positive for dysuria. Negative for difficulty urinating, dyspareunia, enuresis and flank pain.  Psychiatric/Behavioral: Negative.     Per HPI unless specifically indicated above     Objective:    BP (!) 93/59 (BP Location: Left Arm, Patient Position: Sitting, Cuff Size: Normal)   Pulse 60   Temp 98.4 F (36.9 C) (Oral)   Resp 17   Ht 5' 1.02" (1.55 m)   Wt 107 lb (48.5 kg)   LMP 09/13/2016   SpO2 100%   BMI 20.20 kg/m   Wt Readings from Last 3 Encounters:  02/24/17 107 lb (48.5 kg) (12 %, Z= -1.20)*  12/24/16 104 lb 9.6 oz  (47.4 kg) (9 %, Z= -1.37)*  11/23/16 102 lb (46.3 kg) (6 %, Z= -1.58)*   * Growth percentiles are based on CDC 2-20 Years data.    Physical Exam  Constitutional: She is oriented to person, place, and time. She appears well-developed and well-nourished. No distress.  HENT:  Head: Normocephalic and atraumatic.  Right Ear: Hearing normal.  Left Ear: Hearing normal.  Nose: Nose normal.  Eyes: Conjunctivae and lids are normal. Right eye exhibits no discharge. Left eye exhibits no discharge. No scleral icterus.  Cardiovascular: Normal rate, regular rhythm, normal heart sounds and intact distal pulses.  Exam reveals no gallop and no friction rub.   No murmur heard. Pulmonary/Chest: Effort normal and breath sounds normal. No respiratory distress. She has no wheezes. She has no rales. She exhibits no tenderness.  Musculoskeletal: Normal range of motion.  Neurological: She is alert and oriented to person, place, and time.  Skin: Skin is warm, dry and intact. No rash noted. No erythema. No pallor.  Psychiatric: She has a normal mood and affect. Her speech is normal and behavior is normal. Judgment and thought content normal. Cognition and memory are normal.  Nursing note and vitals reviewed.   Results for orders placed or performed in visit on 10/23/16  CBC with Differential/Platelet  Result Value Ref Range   WBC 8.0 3.4 - 10.8 x10E3/uL   RBC 4.49 3.77 - 5.28 x10E6/uL   Hemoglobin 12.7 11.1 -  15.9 g/dL   Hematocrit 09.8 11.9 - 46.6 %   MCV 84 79 - 97 fL   MCH 28.3 26.6 - 33.0 pg   MCHC 33.6 31.5 - 35.7 g/dL   RDW 14.7 82.9 - 56.2 %   Platelets 394 (H) 150 - 379 x10E3/uL   Neutrophils 68 Not Estab. %   Lymphs 23 Not Estab. %   Monocytes 7 Not Estab. %   Eos 2 Not Estab. %   Basos 0 Not Estab. %   Neutrophils Absolute 5.4 1.4 - 7.0 x10E3/uL   Lymphocytes Absolute 1.8 0.7 - 3.1 x10E3/uL   Monocytes Absolute 0.6 0.1 - 0.9 x10E3/uL   EOS (ABSOLUTE) 0.2 0.0 - 0.4 x10E3/uL   Basophils  Absolute 0.0 0.0 - 0.2 x10E3/uL   Immature Granulocytes 0 Not Estab. %   Immature Grans (Abs) 0.0 0.0 - 0.1 x10E3/uL  Hgb A1c w/o eAG  Result Value Ref Range   Hgb A1c MFr Bld 5.4 4.8 - 5.6 %  Comprehensive metabolic panel  Result Value Ref Range   Glucose 88 65 - 99 mg/dL   BUN 11 6 - 20 mg/dL   Creatinine, Ser 1.30 (L) 0.57 - 1.00 mg/dL   GFR calc non Af Amer 139 >59 mL/min/1.73   GFR calc Af Amer 160 >59 mL/min/1.73   BUN/Creatinine Ratio 21 9 - 23   Sodium 144 134 - 144 mmol/L   Potassium 4.9 3.5 - 5.2 mmol/L   Chloride 103 96 - 106 mmol/L   CO2 21 18 - 29 mmol/L   Calcium 9.8 8.7 - 10.2 mg/dL   Total Protein 7.9 6.0 - 8.5 g/dL   Albumin 4.5 3.5 - 5.5 g/dL   Globulin, Total 3.4 1.5 - 4.5 g/dL   Albumin/Globulin Ratio 1.3 1.2 - 2.2   Bilirubin Total 0.8 0.0 - 1.2 mg/dL   Alkaline Phosphatase 66 43 - 101 IU/L   AST 17 0 - 40 IU/L   ALT 10 0 - 32 IU/L  TSH  Result Value Ref Range   TSH 0.792 0.450 - 4.500 uIU/mL      Assessment & Plan:   Problem List Items Addressed This Visit    None    Visit Diagnoses    Acute cystitis with hematuria    -  Primary   Will treat with cipro. Call with any concerns.    Dysuria       +UA   Relevant Orders   UA/M w/rflx Culture, Routine       Follow up plan: Return if symptoms worsen or fail to improve.

## 2017-06-24 ENCOUNTER — Encounter: Payer: Medicaid Other | Admitting: Family Medicine

## 2017-06-25 ENCOUNTER — Encounter: Payer: Self-pay | Admitting: Family Medicine

## 2017-06-25 ENCOUNTER — Ambulatory Visit (INDEPENDENT_AMBULATORY_CARE_PROVIDER_SITE_OTHER): Payer: Medicaid Other | Admitting: Family Medicine

## 2017-06-25 VITALS — BP 112/71 | HR 65 | Temp 98.4°F | Ht 62.7 in | Wt 104.7 lb

## 2017-06-25 DIAGNOSIS — Z113 Encounter for screening for infections with a predominantly sexual mode of transmission: Secondary | ICD-10-CM

## 2017-06-25 DIAGNOSIS — Z0000 Encounter for general adult medical examination without abnormal findings: Secondary | ICD-10-CM

## 2017-06-25 LAB — UA/M W/RFLX CULTURE, ROUTINE
Bilirubin, UA: NEGATIVE
GLUCOSE, UA: NEGATIVE
KETONES UA: NEGATIVE
Leukocytes, UA: NEGATIVE
NITRITE UA: NEGATIVE
PROTEIN UA: NEGATIVE
SPEC GRAV UA: 1.02 (ref 1.005–1.030)
UUROB: 0.2 mg/dL (ref 0.2–1.0)
pH, UA: 5.5 (ref 5.0–7.5)

## 2017-06-25 LAB — MICROSCOPIC EXAMINATION

## 2017-06-25 NOTE — Progress Notes (Signed)
BP 112/71   Pulse 65   Temp 98.4 F (36.9 C) (Oral)   Ht 5' 2.7" (1.593 m)   Wt 104 lb 11.2 oz (47.5 kg)   LMP 06/22/2017 (Exact Date)   SpO2 98%   BMI 18.73 kg/m    Subjective:    Patient ID: Carly Singleton, female    DOB: 1996/02/20, 21 y.o.   MRN: [id number]  HPI: Carly Singleton is a 21 y.o. female presenting on 06/25/2017 for comprehensive medical examination. Current medical complaints include:none  Depression Screen done today and results listed below:  Depression screen [facility] 2[facility]9 06/25/2017 12/24/2016 11/23/2016 10/23/2016  Decreased Interest 0 0 1 1  Down, Depressed, Hopeless 0 0 1 2  PHQ - 2 Score 0 0 2 3  Altered sleeping 0 0 0 1  Tired, decreased energy 0 0 3 2  Change in appetite 2 0 1 3  Feeling bad or failure about yourself  0 0 1 1  Trouble concentrating 0 0 0 1  Moving slowly or fidgety/restless 0 0 1 0  Suicidal thoughts 0 0 0 0  PHQ-9 Score 2 0 8 11    The patient does not have a history of falls. I did not complete a risk assessment for falls. A plan of care for falls was not documented.   Past Medical History:  Past Medical History:  Diagnosis Date  . Medical history non-contributory     Surgical History:  Past Surgical History:  Procedure Laterality Date  . NO PAST SURGERIES      Medications:  Current Outpatient Prescriptions on File Prior to Visit  Medication Sig  . Etonogestrel (NEXPLANON [place]) Inject into the skin.   No current facility-administered medications on file prior to visit.     Allergies:  Allergies  Allergen Reactions  . Azithromycin Itching and Rash    Only seen with IV usage not po medication.  Marland Kitchen. Penicillin G Rash    Social History:  Social History   Social History  . Marital status: Single    Spouse name: N/A  . Number of children: N/A  . Years of education: N/A   Occupational History  . Not on file.   Social History Main Topics  . Smoking status: Never Smoker  . Smokeless tobacco: Never  Used  . Alcohol use No  . Drug use: No  . Sexual activity: Yes    Birth control/ protection: Implant   Other Topics Concern  . Not on file   Social History Narrative  . No narrative on file   History  Smoking Status  . Never Smoker  Smokeless Tobacco  . Never Used   History  Alcohol Use No    Family History:  Family History  Problem Relation Age of Onset  . Down syndrome Daughter   . Cancer Neg Hx   . Diabetes Neg Hx   . Heart disease Neg Hx     Past medical history, surgical history, medications, allergies, family history and social history reviewed with patient today and changes made to appropriate areas of the chart.   Review of Systems - General ROS: negative Psychological ROS: negative Ophthalmic ROS: negative ENT ROS: negative Endocrine ROS: negative Breast ROS: negative for breast lumps Respiratory ROS: no cough, shortness of breath, or wheezing Cardiovascular ROS: no chest pain or dyspnea on exertion Gastrointestinal ROS: no abdominal pain, change in bowel habits, or black or bloody stools Genito-Urinary ROS: no dysuria, trouble voiding, or hematuria Musculoskeletal ROS:  negative Neurological ROS: no TIA or stroke symptoms Dermatological ROS: negative All other ROS negative except what is listed above and in the HPI.      Objective:    BP 112/71   Pulse 65   Temp 98.4 F (36.9 C) (Oral)   Ht 5' 2.7" (1.593 m)   Wt 104 lb 11.2 oz (47.5 kg)   LMP 06/22/2017 (Exact Date)   SpO2 98%   BMI 18.73 kg/m   Wt Readings from Last 3 Encounters:  06/25/17 104 lb 11.2 oz (47.5 kg) (8 %, Z= -1.41)*  02/24/17 107 lb (48.5 kg) (12 %, Z= -1.20)*  12/24/16 104 lb 9.6 oz (47.4 kg) (9 %, Z= -1.37)*   * Growth percentiles are based on CDC 2-20 Years data.    Physical Exam  Constitutional: She is oriented to person, place, and time. She appears well-developed and well-nourished. No distress.  HENT:  Head: Atraumatic.  Right Ear: External ear normal.  Left  Ear: External ear normal.  Nose: Nose normal.  Mouth/Throat: Oropharynx is clear and moist. No oropharyngeal exudate.  Eyes: Conjunctivae are normal. Pupils are equal, round, and reactive to light. No scleral icterus.  Neck: Normal range of motion. Neck supple. No thyromegaly present.  Cardiovascular: Normal rate, regular rhythm, normal heart sounds and intact distal pulses.   Pulmonary/Chest: Effort normal and breath sounds normal. No respiratory distress. Right breast exhibits no mass, no skin change and no tenderness. Left breast exhibits no mass, no skin change and no tenderness.  Abdominal: Soft. Bowel sounds are normal. She exhibits no mass. There is no tenderness.  Musculoskeletal: Normal range of motion. She exhibits no edema or tenderness.  Lymphadenopathy:    She has no cervical adenopathy.    She has no axillary adenopathy.  Neurological: She is alert and oriented to person, place, and time. No cranial nerve deficit.  Skin: Skin is warm and dry. No rash noted.  Psychiatric: She has a normal mood and affect. Her behavior is normal.  Nursing note and vitals reviewed.  Results for orders placed or performed in visit on 02/24/17  Microscopic Examination  Result Value Ref Range   WBC, UA 0-5 0 - 5 /hpf   RBC, UA None seen 0 - 2 /hpf   Epithelial Cells (non renal) 0-10 0 - 10 /hpf   Mucus, UA Present (A) Not Estab.   Bacteria, UA Moderate (A) None seen/Few  UA/M w/rflx Culture, Routine  Result Value Ref Range   Specific Gravity, UA 1.025 1.005 - 1.030   pH, UA 5.0 5.0 - 7.5   Color, UA Yellow Yellow   Appearance Ur Clear Clear   Leukocytes, UA 1+ (A) Negative   Protein, UA Trace (A) Negative/Trace   Glucose, UA Negative Negative   Ketones, UA Negative Negative   RBC, UA Trace (A) Negative   Bilirubin, UA Negative Negative   Urobilinogen, Ur 0.2 0.2 - 1.0 mg/dL   Nitrite, UA Negative Negative   Microscopic Examination See below:       Assessment & Plan:   Problem List  Items Addressed This Visit    None    Visit Diagnoses    Annual physical exam    -  Primary   No concerns today, will do basic fasting labs. Await results.    Relevant Orders   CBC with Differential/Platelet   Comprehensive metabolic panel   Lipid Panel w/o Chol/HDL Ratio   TSH   UA/M w/rflx Culture, Routine   Routine screening  for STI (sexually transmitted infection)       Relevant Orders   GC/Chlamydia Probe Amp   HIV antibody   HSV(herpes simplex vrs) 1+2 ab-IgG   RPR      Follow up plan: Return in about 1 year (around 06/25/2018) for CPE.   LABORATORY TESTING:  - Pap smear: not applicable  IMMUNIZATIONS:   - Tdap: Tetanus vaccination status reviewed: last tetanus booster within 10 years. - Influenza: Postponed to flu season  PATIENT COUNSELING:   Advised to take 1 mg of folate supplement per day if capable of pregnancy.   Sexuality: Discussed sexually transmitted diseases, partner selection, use of condoms, avoidance of unintended pregnancy  and contraceptive alternatives.   Advised to avoid cigarette smoking.  I discussed with the patient that most people either abstain from alcohol or drink within safe limits (<=14/week and <=4 drinks/occasion for males, <=7/weeks and <= 3 drinks/occasion for females) and that the risk for alcohol disorders and other health effects rises proportionally with the number of drinks per week and how often a drinker exceeds daily limits.  Discussed cessation/primary prevention of drug use and availability of treatment for abuse.   Diet: Encouraged to adjust caloric intake to maintain  or achieve ideal body weight, to reduce intake of dietary saturated fat and total fat, to limit sodium intake by avoiding high sodium foods and not adding table salt, and to maintain adequate dietary potassium and calcium preferably from fresh fruits, vegetables, and low-fat dairy products.    stressed the importance of regular exercise  Injury prevention:  Discussed safety belts, safety helmets, smoke detector, smoking near bedding or upholstery.   Dental health: Discussed importance of regular tooth brushing, flossing, and dental visits.   NEXT PREVENTATIVE PHYSICAL DUE IN 1 YEAR. Return in about 1 year (around 06/25/2018) for CPE.

## 2017-06-26 LAB — COMPREHENSIVE METABOLIC PANEL
ALBUMIN: 4.6 g/dL (ref 3.5–5.5)
ALK PHOS: 68 IU/L (ref 39–117)
ALT: 12 IU/L (ref 0–32)
AST: 17 IU/L (ref 0–40)
Albumin/Globulin Ratio: 1.4 (ref 1.2–2.2)
BILIRUBIN TOTAL: 0.5 mg/dL (ref 0.0–1.2)
BUN / CREAT RATIO: 21 (ref 9–23)
BUN: 10 mg/dL (ref 6–20)
CHLORIDE: 102 mmol/L (ref 96–106)
CO2: 21 mmol/L (ref 20–29)
Calcium: 9.5 mg/dL (ref 8.7–10.2)
Creatinine, Ser: 0.48 mg/dL — ABNORMAL LOW (ref 0.57–1.00)
GFR calc Af Amer: 164 mL/min/{1.73_m2} (ref >59)
GFR calc non Af Amer: 143 mL/min/{1.73_m2} (ref >59)
GLOBULIN, TOTAL: 3.2 g/dL (ref 1.5–4.5)
Glucose: 92 mg/dL (ref 65–99)
Potassium: 3.9 mmol/L (ref 3.5–5.2)
SODIUM: 138 mmol/L (ref 134–144)
Total Protein: 7.8 g/dL (ref 6.0–8.5)

## 2017-06-26 LAB — CBC WITH DIFFERENTIAL/PLATELET
BASOS ABS: 0.1 10*3/uL (ref 0.0–0.2)
Basos: 1 %
EOS (ABSOLUTE): 0.1 10*3/uL (ref 0.0–0.4)
Eos: 2 %
HEMOGLOBIN: 11.9 g/dL (ref 11.1–15.9)
Hematocrit: 35.9 % (ref 34.0–46.6)
Immature Grans (Abs): 0 10*3/uL (ref 0.0–0.1)
Immature Granulocytes: 0 %
LYMPHS ABS: 2.2 10*3/uL (ref 0.7–3.1)
Lymphs: 41 %
MCH: 28 pg (ref 26.6–33.0)
MCHC: 33.1 g/dL (ref 31.5–35.7)
MCV: 85 fL (ref 79–97)
MONOCYTES: 10 %
MONOS ABS: 0.6 10*3/uL (ref 0.1–0.9)
NEUTROS ABS: 2.6 10*3/uL (ref 1.4–7.0)
Neutrophils: 46 %
Platelets: 386 10*3/uL — ABNORMAL HIGH (ref 150–379)
RBC: 4.25 x10E6/uL (ref 3.77–5.28)
RDW: 13.9 % (ref 12.3–15.4)
WBC: 5.5 10*3/uL (ref 3.4–10.8)

## 2017-06-26 LAB — LIPID PANEL W/O CHOL/HDL RATIO
CHOLESTEROL TOTAL: 170 mg/dL — AB (ref 100–169)
HDL: 52 mg/dL (ref >39)
LDL Calculated: 102 mg/dL (ref 0–109)
Triglycerides: 80 mg/dL (ref 0–89)
VLDL Cholesterol Cal: 16 mg/dL (ref 5–40)

## 2017-06-26 LAB — HSV(HERPES SIMPLEX VRS) I + II AB-IGG
HSV 1 GLYCOPROTEIN G AB, IGG: 36.8 {index} — AB (ref 0.00–0.90)
HSV 2 GLYCOPROTEIN G AB, IGG: 4.74 {index} — AB (ref 0.00–0.90)

## 2017-06-26 LAB — HIV ANTIBODY (ROUTINE TESTING W REFLEX): HIV Screen 4th Generation wRfx: NONREACTIVE

## 2017-06-26 LAB — TSH: TSH: 1.06 u[IU]/mL (ref 0.450–4.500)

## 2017-06-26 LAB — RPR: RPR Ser Ql: NONREACTIVE

## 2017-06-27 LAB — GC/CHLAMYDIA PROBE AMP
Chlamydia trachomatis, NAA: NEGATIVE
NEISSERIA GONORRHOEAE BY PCR: NEGATIVE

## 2017-06-29 ENCOUNTER — Telehealth: Payer: Self-pay | Admitting: Unknown Physician Specialty

## 2017-06-29 MED ORDER — AZITHROMYCIN 1 G PO PACK: 1.0000 g | PACK | Freq: Once | ORAL | 0 refills | Status: AC

## 2017-06-29 MED ORDER — VALACYCLOVIR HCL 1 G PO TABS
1000.0000 mg | ORAL_TABLET | Freq: Two times a day (BID) | ORAL | 0 refills | Status: DC
Start: 2017-06-29 — End: 2018-05-21

## 2017-06-29 NOTE — Telephone Encounter (Signed)
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  We will continue discussing chronic suppressive therapy if further outbreaks or becoming sexually active again.

## 2017-07-01 ENCOUNTER — Telehealth: Payer: Self-pay | Admitting: Family Medicine

## 2017-07-01 NOTE — Telephone Encounter (Signed)
Patient would like Dr Laural BenesJohnson to call her back when she returns next week to discuss the results of her tests  Thanks

## 2017-07-03 ENCOUNTER — Emergency Department
Admission: EM | Admit: 2017-07-03 | Discharge: 2017-07-03 | Disposition: A | Discharge status: Home / Self Care | Payer: Medicaid Other | Attending: Emergency Medicine | Admitting: Emergency Medicine

## 2017-07-03 ENCOUNTER — Emergency Department: Payer: Medicaid Other

## 2017-07-03 ENCOUNTER — Encounter: Payer: Self-pay | Admitting: Emergency Medicine

## 2017-07-03 DIAGNOSIS — M940 Chondrocostal junction syndrome [Tietze]: Secondary | ICD-10-CM | POA: Insufficient documentation

## 2017-07-03 DIAGNOSIS — Z79899 Other long term (current) drug therapy: Secondary | ICD-10-CM | POA: Insufficient documentation

## 2017-07-03 DIAGNOSIS — R079 Chest pain, unspecified: Secondary | ICD-10-CM | POA: Diagnosis present

## 2017-07-03 HISTORY — DX: Chlamydial infection, unspecified: A74.9

## 2017-07-03 HISTORY — DX: Other specified predominantly sexually transmitted diseases: A63.8

## 2017-07-03 LAB — BASIC METABOLIC PANEL
Anion gap: 7 (ref 5–15)
BUN: 12 mg/dL (ref 6–20)
CO2: 23 mmol/L (ref 22–32)
CREATININE: 0.55 mg/dL (ref 0.44–1.00)
Calcium: 9.4 mg/dL (ref 8.9–10.3)
Chloride: 108 mmol/L (ref 101–111)
GFR calc non Af Amer: >60 mL/min (ref >60)
Glucose, Bld: 98 mg/dL (ref 65–99)
POTASSIUM: 3.9 mmol/L (ref 3.5–5.1)
Sodium: 138 mmol/L (ref 135–145)

## 2017-07-03 LAB — CBC
HEMATOCRIT: 36.3 % (ref 35.0–47.0)
HEMOGLOBIN: 12.5 g/dL (ref 12.0–16.0)
MCH: 29 pg (ref 26.0–34.0)
MCHC: 34.5 g/dL (ref 32.0–36.0)
MCV: 83.9 fL (ref 80.0–100.0)
Platelets: 417 10*3/uL (ref 150–440)
RBC: 4.33 MIL/uL (ref 3.80–5.20)
RDW: 13.1 % (ref 11.5–14.5)
WBC: 6.5 10*3/uL (ref 3.6–11.0)

## 2017-07-03 LAB — LIPASE, BLOOD: LIPASE: 25 U/L (ref 11–51)

## 2017-07-03 MED ORDER — ONDANSETRON HCL 4 MG PO TABS
4.0000 mg | ORAL_TABLET | Freq: Once | ORAL | Status: AC
  Administered 2017-07-03: 4 mg via ORAL
  Filled 2017-07-03: qty 1

## 2017-07-03 MED ORDER — KETOROLAC TROMETHAMINE 60 MG/2ML IM SOLN
30.0000 mg | Freq: Once | INTRAMUSCULAR | Status: AC
  Administered 2017-07-03: 30 mg via INTRAMUSCULAR
  Filled 2017-07-03: qty 2

## 2017-07-03 MED ORDER — ONDANSETRON HCL 4 MG PO TABS: 4.0000 mg | ORAL_TABLET | Freq: Every day | ORAL | 0 refills | Status: DC | PRN

## 2017-07-03 MED ORDER — IBUPROFEN 600 MG PO TABS: 600.0000 mg | ORAL_TABLET | Freq: Four times a day (QID) | ORAL | 0 refills | Status: DC | PRN

## 2017-07-03 NOTE — ED Triage Notes (Signed)
Patient presents to the ED with left sided chest pain x 1 week.  Patient states the pain originated in patient's left breast but is now more on the left side of patient's sternum.  Patient reports tenderness on palpation.  Patient denies shortness of breath or dizziness at this time.  No obvious distress at this time.  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

## 2017-07-03 NOTE — ED Provider Notes (Signed)
[facility] Emergency Department Provider Note  ____________________________________________  Time seen: Approximately 3:16 PM  I have reviewed the triage vital signs and the nursing notes.   HISTORY  Chief Complaint Chest Pain    HPI Erlene QuanYesenia Gonzalez Cruz is a 21 y.o. female that presents to emergency department with sternum pain for one week. Patient states the pain was originally near her arm but has now moved to the middle of her chest. It hurts when she presses on it. She went to her primary care provider to have him do a breast exam because she was also having breast pain. She states that she also has had some nausea this week. He recently had an upper respiratory infection. She is sure that she is not pregnant and her last menstrual cycle was last week.She denies fever, shortness of breath, vomiting, abdominal pain.   Past Medical History:  Diagnosis Date  . Chlamydia   . Medical history non-contributory   . 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 Moderate single current episode of major depressive disorder (HCC) 10/23/2016    Past Surgical History:  Procedure Laterality Date  . NO PAST SURGERIES      Prior to Admission medications   Medication Sig Start Date End Date Taking? Authorizing Provider  Etonogestrel (NEXPLANON [place]) Inject into the skin.    [provider]  ibuprofen (ADVIL,MOTRIN) 600 MG tablet Take 1 tablet (600 mg total) by mouth every 6 (six) hours as needed. 07/03/17   Enid DerryWagner, Tvisha Schwoerer, PA-C  ondansetron (ZOFRAN) 4 MG tablet Take 1 tablet (4 mg total) by mouth daily as needed for nausea or vomiting. 07/03/17 07/03/18  Enid DerryWagner, Markell Schrier, PA-C  valACYclovir (VALTREX) 1000 MG tablet Take 1 tablet (1,000 mg total) by mouth 2 (two) times daily. 06/29/17   Gabriel CirriWicker, Cheryl, NP    Allergies Azithromycin and Penicillin g  Family History  Problem  Relation Age of Onset  . Down syndrome Daughter   . Cancer Neg Hx   . Diabetes Neg Hx   . Heart disease Neg Hx     Social History Social History  Substance Use Topics  . Smoking status: Never Smoker  . Smokeless tobacco: Never Used  . Alcohol use No     Review of Systems  Constitutional: No fever/chills Respiratory: No SOB. Gastrointestinal: No abdominal pain.  No vomiting.  Musculoskeletal: Positive for chest pain. Skin: Negative for rash, abrasions, lacerations, ecchymosis. Neurological: Negative for headaches, numbness or tingling   ____________________________________________   PHYSICAL EXAM:  VITAL SIGNS: ED Triage Vitals  Enc Vitals Group     BP 07/03/17 1229 (!) 115/97     Pulse Rate 07/03/17 1229 82     Resp 07/03/17 1229 18     Temp 07/03/17 1229 99 F (37.2 C)     Temp Source 07/03/17 1229 Oral     SpO2 07/03/17 1229 100 %     Weight 07/03/17 1230 104 lb (47.2 kg)     Height 07/03/17 1230 5\' 2"  (1.575 m)     Head Circumference --      Peak Flow --      Pain Score 07/03/17 1229 8     Pain Loc --      Pain Edu? --      Excl. in GC? --      Constitutional: Alert and oriented. Well appearing and in no acute distress.  Eyes: Conjunctivae are normal. PERRL. EOMI. Head: Atraumatic. ENT:      Ears:      Nose: No congestion/rhinnorhea.      Mouth/Throat: Mucous membranes are moist.  Neck: No stridor.  Cardiovascular: Normal rate, regular rhythm.  Good peripheral circulation. Respiratory: Normal respiratory effort without tachypnea or retractions. Lungs CTAB. Good air entry to the bases with no decreased or absent breath sounds. Gastrointestinal: Bowel sounds 4 quadrants. Soft and nontender to palpation. No guarding or rigidity. No palpable masses. No distention.  Musculoskeletal: Full range of motion to all extremities. No gross deformities appreciated. Tenderness to palpation over sternum. Neurologic:  Normal speech and language. No gross focal  neurologic deficits are appreciated.  Skin:  Skin is warm, dry and intact. No rash noted.    ____________________________________________   LABS (all labs ordered are listed, but only abnormal results are displayed)  Labs Reviewed  BASIC METABOLIC PANEL  LIPASE, BLOOD  CBC   ____________________________________________  EKG   ____________________________________________  RADIOLOGY Lexine Baton, personally viewed and evaluated these images (plain radiographs) as part of my medical decision making, as well as reviewing the written report by the radiologist.  Dg Chest 2 View  Result Date: 07/03/2017 CLINICAL DATA:  Patient with chest pain.  Tenderness. EXAM: CHEST  2 VIEW COMPARISON:  None. FINDINGS: Normal cardiac and mediastinal contours. No consolidative pulmonary opacities. No pleural effusion or pneumothorax. Mild pectus deformity. IMPRESSION: No active cardiopulmonary disease. Electronically Signed   By: Annia Belt M.D.   On: 07/03/2017 13:45    ____________________________________________    PROCEDURES  Procedure(s) performed:    Procedures    Medications  ondansetron (ZOFRAN) tablet 4 mg (4 mg Oral Given 07/03/17 1409)  ketorolac (TORADOL) injection 30 mg (30 mg Intramuscular Given 07/03/17 1453)     ____________________________________________   INITIAL IMPRESSION / ASSESSMENT AND PLAN / ED COURSE  Pertinent labs & imaging results that were available during my care of the patient were reviewed by me and considered in my medical decision making (see chart for details).  Review of the [place] CSRS was performed in accordance of the [facility] prior to dispensing any controlled drugs.   Patient's diagnosis is consistent with costochondritis. Vital signs and exam are reassuring. Lab work within normal limits. Chest x-ray negative for acute cardiopulmonary processes. No STEMI and EKG. Patient will be discharged home with prescriptions for ibuprofen and Zofran. Patient  is to follow up with PCP as directed. Patient is given ED precautions to return to the ED for any worsening or new symptoms.     ____________________________________________  FINAL CLINICAL IMPRESSION(S) / ED DIAGNOSES  Final diagnoses:  Costochondritis      NEW MEDICATIONS STARTED DURING THIS VISIT:  Discharge Medication List as of 07/03/2017  3:01 PM    START taking these medications   Details  ibuprofen (ADVIL,MOTRIN) 600 MG tablet Take 1 tablet (600 mg total) by mouth every 6 (six) hours as needed., Starting Sat 07/03/2017, Print    ondansetron (ZOFRAN) 4 MG tablet Take 1 tablet (4 mg total) by mouth daily as needed for nausea or vomiting., Starting Sat 07/03/2017, Until Sun 07/03/2018, Print            This chart was dictated using voice recognition software/Dragon. Despite best efforts to proofread, errors can occur which can change the meaning. Any change was purely unintentional.    Enid Derry, PA-C 07/03/17 1532    Don Perking, [place], MD 07/04/17 [phone]

## 2017-07-03 NOTE — ED Notes (Signed)
Pt verbalizes understanding of discharge instructions.

## 2017-07-05 ENCOUNTER — Ambulatory Visit: Payer: Medicaid Other | Admitting: Family Medicine

## 2017-07-05 NOTE — Telephone Encounter (Signed)
Carly MaxwellCheryl called patient with results last week, patient was seen by Fleet Contrasachel on 06/25/17

## 2017-07-05 NOTE — Telephone Encounter (Signed)
Called patient to check to see if she still needed to talk to [facility] but her [facility] box was full. Will try again later.

## 2017-07-06 NOTE — Telephone Encounter (Signed)
Spoke with patient, she wanted to know what the Valtrx was for, patient informed that it is to help prevent a breakout.

## 2017-07-06 NOTE — Telephone Encounter (Signed)
Tiff- can you see if she still needs to talk to me?

## 2017-09-14 ENCOUNTER — Ambulatory Visit (INDEPENDENT_AMBULATORY_CARE_PROVIDER_SITE_OTHER): Payer: Self-pay | Admitting: Family Medicine

## 2017-09-14 ENCOUNTER — Encounter: Payer: Self-pay | Admitting: Family Medicine

## 2017-09-14 VITALS — BP 102/65 | HR 59 | Temp 97.6°F | Wt 108.5 lb

## 2017-09-14 DIAGNOSIS — B360 Pityriasis versicolor: Secondary | ICD-10-CM

## 2017-09-14 DIAGNOSIS — Z23 Encounter for immunization: Secondary | ICD-10-CM

## 2017-09-14 DIAGNOSIS — M940 Chondrocostal junction syndrome [Tietze]: Secondary | ICD-10-CM

## 2017-09-14 MED ORDER — KETOCONAZOLE 2 % EX SHAM: 1.0000 "application " | MEDICATED_SHAMPOO | CUTANEOUS | 0 refills | Status: DC

## 2017-09-14 MED ORDER — NAPROXEN 500 MG PO TABS: 500.0000 mg | ORAL_TABLET | Freq: Two times a day (BID) | ORAL | 2 refills | Status: DC

## 2017-09-14 NOTE — Patient Instructions (Addendum)
Costochondritis Costochondritis is swelling and irritation (inflammation) of the tissue (cartilage) that connects your ribs to your breastbone (sternum). This causes pain in the front of your chest. The pain usually starts gradually and involves more than one rib. What are the causes? The exact cause of this condition is not always known. It results from stress on the cartilage where your ribs attach to your sternum. The cause of this stress could be:  Chest injury (trauma).  Exercise or activity, such as lifting.  Severe coughing.  What increases the risk? You may be at higher risk for this condition if you:  Are female.  Are 30?20 years old.  Recently started a new exercise or work activity.  Have low levels of vitamin D.  Have a condition that makes you cough frequently.  What are the signs or symptoms? The main symptom of this condition is chest pain. The pain:  Usually starts gradually and can be sharp or dull.  Gets worse with deep breathing, coughing, or exercise.  Gets better with rest.  May be worse when you press on the sternum-rib connection (tenderness).  How is this diagnosed? This condition is diagnosed based on your symptoms, medical history, and a physical exam. Your health care provider will check for tenderness when pressing on your sternum. This is the most important finding. You may also have tests to rule out other causes of chest pain. These may include:  A chest X-ray to check for lung problems.  An electrocardiogram (ECG) to see if you have a heart problem that could be causing the pain.  An imaging scan to rule out a chest or rib fracture.  How is this treated? This condition usually goes away on its own over time. Your health care provider may prescribe an NSAID to reduce pain and inflammation. Your health care provider may also suggest that you:  Rest and avoid activities that make pain worse.  Apply heat or cold to the area to reduce pain  and inflammation.  Do exercises to stretch your chest muscles.  If these treatments do not help, your health care provider may inject a numbing medicine at the sternum-rib connection to help relieve the pain. Follow these instructions at home:  Avoid activities that make pain worse. This includes any activities that use chest, abdominal, and side muscles.  If directed, put ice on the painful area: ? Put ice in a plastic bag. ? Place a towel between your skin and the bag. ? Leave the ice on for 20 minutes, 2-3 times a day.  If directed, apply heat to the affected area as often as told by your health care provider. Use the heat source that your health care provider recommends, such as a moist heat pack or a heating pad. ? Place a towel between your skin and the heat source. ? Leave the heat on for 20-30 minutes. ? Remove the heat if your skin turns bright red. This is especially important if you are unable to feel pain, heat, or cold. You may have a greater risk of getting burned.  Take over-the-counter and prescription medicines only as told by your health care provider.  Return to your normal activities as told by your health care provider. Ask your health care provider what activities are safe for you.  Keep all follow-up visits as told by your health care provider. This is important. Contact a health care provider if:  You have chills or a fever.  Your pain does not go   away or it gets worse.  You have a cough that does not go away (is persistent). Get help right away if:  You have shortness of breath. This information is not intended to replace advice given to you by your health care provider. Make sure you discuss any questions you have with your health care provider. Document Released: 09/23/2005 Document Revised: 07/03/2016 Document Reviewed: 04/08/2016 Elsevier Interactive Patient Education  2018 Elsevier Inc. Tinea Versicolor Tinea versicolor is a common fungal infection  of the skin. It causes a rash that appears as light or dark patches on the skin. The rash most often occurs on the chest, back, neck, or upper arms. This condition is more common during warm weather. Other than affecting how your skin looks, tinea versicolor usually does not cause other problems. In most cases, the infection goes away in a few weeks with treatment. It may take a few months for the patches on your skin to clear up. What are the causes? Tinea versicolor occurs when a type of fungus that is normally present on the skin starts to overgrow. This fungus is a kind of yeast. The exact cause of the overgrowth is not known. This condition cannot be passed from one person to another (noncontagious). What increases the risk? This condition is more likely to develop when certain factors are present, such as:  Heat and humidity.  Sweating too much.  Hormone changes.  Oily skin.  A weak defense (immune) system.  What are the signs or symptoms? Symptoms of this condition may include:  A rash on your skin that is made up of light or dark patches. The rash may have: ? Patches of tan or pink spots on light skin. ? Patches of white or brown spots on dark skin. ? Patches of skin that do not tan. ? Well-marked edges. ? Scales on the discolored areas.  Mild itching.  How is this diagnosed? A health care provider can usually diagnose this condition by looking at your skin. During the exam, he or she may use ultraviolet light to help determine the extent of the infection. In some cases, a skin sample may be taken by scraping the rash. This sample will be viewed under a microscope to check for yeast overgrowth. How is this treated? Treatment for this condition may include:  Dandruff shampoo that is applied to the affected skin during showers or bathing.  Over-the-counter medicated skin cream, lotion, or soaps.  Prescription antifungal medicine in the form of skin cream or  pills.  Medicine to help reduce itching.  Follow these instructions at home:  Take medicines only as directed by your health care provider.  Apply dandruff shampoo to the affected area if told to do so by your health care provider. You may be instructed to scrub the affected skin for several minutes each day.  Do not scratch the affected area of skin.  Avoid hot and humid conditions.  Do not use tanning booths.  Try to avoid sweating a lot. Contact a health care provider if:  Your symptoms get worse.  You have a fever.  You have redness, swelling, or pain at the site of your rash.  You have fluid, blood, or pus coming from your rash.  Your rash returns after treatment. This information is not intended to replace advice given to you by your health care provider. Make sure you discuss any questions you have with your health care provider. Document Released: 12/11/2000 Document Revised: 08/16/2016 Document Reviewed: 09/25/2014 Elsevier  Interactive Patient Education  [employer].

## 2017-09-14 NOTE — Progress Notes (Signed)
BP 102/65 (BP Location: Left Arm, Patient Position: Sitting, Cuff Size: Normal)   Pulse (!) 59   Temp 97.6 F (36.4 C)   Wt 108 lb 8 oz (49.2 kg)   SpO2 99%   BMI 19.84 kg/m    Subjective:    Patient ID: Carly Singleton, female    DOB: 07-Jul-1996, 21 y.o.   MRN: [id number]  HPI: Carly Singleton is a 21 y.o. female  Chief Complaint  Patient presents with  . skin lesion   SKIN LESION Duration: at least a month Location: on her back Painful: no Itching: no Onset: sudden Context: not changing Associated signs and symptoms: none  CHEST PAIN Time since onset: Chronic- was seen at the ER and diagnosed with costochondritis. No better Onset: gradual Quality: sharp, aching, throbbing Severity: moderate Location: left para substernal Radiation: none Episode duration:  Frequency: intermittent Related to exertion: no Trauma: no Anxiety/recent stressors: no Status: better Treatments attempted: ibuprofen  Current pain status: pain free Shortness of breath: no Cough: yes, non-productive Nausea: no Diaphoresis: no Heartburn: no Palpitations: no   Relevant past medical, surgical, family and social history reviewed and updated as indicated. Interim medical history since our last visit reviewed. Allergies and medications reviewed and updated.  Review of Systems  Constitutional: Negative.   Respiratory: Negative.   Cardiovascular: Positive for chest pain. Negative for palpitations and leg swelling.  Skin: Positive for rash. Negative for color change, pallor and wound.  Psychiatric/Behavioral: Negative.     Per HPI unless specifically indicated above     Objective:    BP 102/65 (BP Location: Left Arm, Patient Position: Sitting, Cuff Size: Normal)   Pulse (!) 59   Temp 97.6 F (36.4 C)   Wt 108 lb 8 oz (49.2 kg)   SpO2 99%   BMI 19.84 kg/m   Wt Readings from Last 3 Encounters:  09/14/17 108 lb 8 oz (49.2 kg) (13 %, Z= -1.14)*  07/03/17 104 lb  (47.2 kg) (7 %, Z= -1.46)*  06/25/17 104 lb 11.2 oz (47.5 kg) (8 %, Z= -1.41)*   * Growth percentiles are based on CDC 2-20 Years data.    Physical Exam  Constitutional: She is oriented to person, place, and time. She appears well-developed and well-nourished. No distress.  HENT:  Head: Normocephalic and atraumatic.  Right Ear: Hearing normal.  Left Ear: Hearing normal.  Nose: Nose normal.  Eyes: Conjunctivae and lids are normal. Right eye exhibits no discharge. Left eye exhibits no discharge. No scleral icterus.  Cardiovascular: Normal rate, regular rhythm, normal heart sounds and intact distal pulses.  Exam reveals no gallop and no friction rub.   No murmur heard. Pulmonary/Chest: Effort normal and breath sounds normal. No respiratory distress. She has no wheezes. She has no rales. Chest wall is not dull to percussion. She exhibits no mass, no tenderness, no bony tenderness, no laceration, no crepitus, no edema, no deformity, no swelling and no retraction.  Tenderness to palpation of sternum  Musculoskeletal: Normal range of motion.  Neurological: She is alert and oriented to person, place, and time.  Skin: Skin is warm and intact. Rash noted. She is not diaphoretic. No erythema. No pallor.  Hyperpigmented patch on her L side  Psychiatric: She has a normal mood and affect. Her speech is normal and behavior is normal. Judgment and thought content normal. Cognition and memory are normal.  Nursing note and vitals reviewed.   Results for orders placed or performed during the hospital encounter  of 07/03/17  Basic metabolic panel  Result Value Ref Range   Sodium 138 135 - 145 mmol/L   Potassium 3.9 3.5 - 5.1 mmol/L   Chloride 108 101 - 111 mmol/L   CO2 23 22 - 32 mmol/L   Glucose, Bld 98 65 - 99 mg/dL   BUN 12 6 - 20 mg/dL   Creatinine, Ser 2.95 0.44 - 1.00 mg/dL   Calcium 9.4 8.9 - 62.1 mg/dL   GFR calc non Af Amer >60 >60 mL/min   GFR calc Af Amer >60 >60 mL/min   Anion gap 7 5 -  15  Lipase, blood  Result Value Ref Range   Lipase 25 11 - 51 U/L  CBC  Result Value Ref Range   WBC 6.5 3.6 - 11.0 K/uL   RBC 4.33 3.80 - 5.20 MIL/uL   Hemoglobin 12.5 12.0 - 16.0 g/dL   HCT 30.8 65.7 - 84.6 %   MCV 83.9 80.0 - 100.0 fL   MCH 29.0 26.0 - 34.0 pg   MCHC 34.5 32.0 - 36.0 g/dL   RDW 96.2 95.2 - 84.1 %   Platelets 417 150 - 440 K/uL      Assessment & Plan:   Problem List Items Addressed This Visit    None    Visit Diagnoses    Tinea versicolor    -  Primary   Will treat with ketoconazole. Call with any concerns.    Costochondritis       Will treat with naproxen. Information provided. Call with any concerns.    Immunization due       Flu shot given today.   Relevant Orders   Flu Vaccine QUAD 6+ mos PF IM (Fluarix Quad PF) (Completed)       Follow up plan: Return if symptoms worsen or fail to improve.

## 2017-12-06 ENCOUNTER — Ambulatory Visit: Payer: Medicaid Other | Admitting: Family Medicine

## 2017-12-23 ENCOUNTER — Emergency Department: Payer: Self-pay

## 2017-12-23 ENCOUNTER — Emergency Department
Admission: EM | Admit: 2017-12-23 | Discharge: 2017-12-23 | Disposition: A | Discharge status: Home / Self Care | Payer: Self-pay | Attending: Emergency Medicine | Admitting: Emergency Medicine

## 2017-12-23 ENCOUNTER — Other Ambulatory Visit: Payer: Self-pay

## 2017-12-23 ENCOUNTER — Encounter: Payer: Self-pay | Admitting: Emergency Medicine

## 2017-12-23 DIAGNOSIS — Z79899 Other long term (current) drug therapy: Secondary | ICD-10-CM | POA: Insufficient documentation

## 2017-12-23 DIAGNOSIS — N12 Tubulo-interstitial nephritis, not specified as acute or chronic: Secondary | ICD-10-CM | POA: Insufficient documentation

## 2017-12-23 LAB — URINALYSIS, COMPLETE (UACMP) WITH MICROSCOPIC
BILIRUBIN URINE: NEGATIVE
Glucose, UA: NEGATIVE mg/dL
KETONES UR: 5 mg/dL — AB
Nitrite: NEGATIVE
PH: 6 (ref 5.0–8.0)
Protein, ur: 100 mg/dL — AB
Specific Gravity, Urine: 1.013 (ref 1.005–1.030)

## 2017-12-23 LAB — CBC WITH DIFFERENTIAL/PLATELET
Basophils Absolute: 0 10*3/uL (ref 0–0.1)
Basophils Relative: 0 %
EOS PCT: 0 %
Eosinophils Absolute: 0 10*3/uL (ref 0–0.7)
HEMATOCRIT: 36.3 % (ref 35.0–47.0)
Hemoglobin: 12.3 g/dL (ref 12.0–16.0)
LYMPHS ABS: 1.3 10*3/uL (ref 1.0–3.6)
LYMPHS PCT: 9 %
MCH: 28.6 pg (ref 26.0–34.0)
MCHC: 34 g/dL (ref 32.0–36.0)
MCV: 84.1 fL (ref 80.0–100.0)
MONO ABS: 0.8 10*3/uL (ref 0.2–0.9)
Monocytes Relative: 6 %
NEUTROS ABS: 11.3 10*3/uL — AB (ref 1.4–6.5)
Neutrophils Relative %: 85 %
PLATELETS: 353 10*3/uL (ref 150–440)
RBC: 4.31 MIL/uL (ref 3.80–5.20)
RDW: 13.3 % (ref 11.5–14.5)
WBC: 13.4 10*3/uL — ABNORMAL HIGH (ref 3.6–11.0)

## 2017-12-23 LAB — COMPREHENSIVE METABOLIC PANEL
ALBUMIN: 4.2 g/dL (ref 3.5–5.0)
ALT: 13 U/L — ABNORMAL LOW (ref 14–54)
AST: 19 U/L (ref 15–41)
Alkaline Phosphatase: 70 U/L (ref 38–126)
Anion gap: 5 (ref 5–15)
BUN: 10 mg/dL (ref 6–20)
CHLORIDE: 104 mmol/L (ref 101–111)
CO2: 24 mmol/L (ref 22–32)
Calcium: 9.1 mg/dL (ref 8.9–10.3)
Creatinine, Ser: 0.72 mg/dL (ref 0.44–1.00)
GFR calc Af Amer: >60 mL/min (ref >60)
GFR calc non Af Amer: >60 mL/min (ref >60)
GLUCOSE: 119 mg/dL — AB (ref 65–99)
POTASSIUM: 3.9 mmol/L (ref 3.5–5.1)
SODIUM: 133 mmol/L — AB (ref 135–145)
Total Bilirubin: 0.4 mg/dL (ref 0.3–1.2)
Total Protein: 8.1 g/dL (ref 6.5–8.1)

## 2017-12-23 LAB — PREGNANCY, URINE: PREG TEST UR: NEGATIVE

## 2017-12-23 LAB — POCT PREGNANCY, URINE: PREG TEST UR: NEGATIVE

## 2017-12-23 MED ORDER — ONDANSETRON HCL 4 MG/2ML IJ SOLN
4.0000 mg | Freq: Once | INTRAMUSCULAR | Status: AC
  Administered 2017-12-23: 4 mg via INTRAVENOUS
  Filled 2017-12-23: qty 2

## 2017-12-23 MED ORDER — CIPROFLOXACIN HCL 500 MG PO TABS
750.0000 mg | ORAL_TABLET | Freq: Once | ORAL | Status: AC
  Administered 2017-12-23: 750 mg via ORAL
  Filled 2017-12-23: qty 2

## 2017-12-23 MED ORDER — CIPROFLOXACIN HCL 500 MG PO TABS: 500.0000 mg | ORAL_TABLET | Freq: Two times a day (BID) | ORAL | 0 refills | Status: AC

## 2017-12-23 MED ORDER — MORPHINE SULFATE (PF) 4 MG/ML IV SOLN
4.0000 mg | Freq: Once | INTRAVENOUS | Status: AC
  Administered 2017-12-23: 4 mg via INTRAVENOUS
  Filled 2017-12-23: qty 1

## 2017-12-23 MED ORDER — HYDROCODONE-ACETAMINOPHEN 5-325 MG PO TABS: 1.0000 | ORAL_TABLET | Freq: Four times a day (QID) | ORAL | 0 refills | Status: DC | PRN

## 2017-12-23 NOTE — ED Triage Notes (Signed)
Pt in with co right flank pain that started at midnight tonight. States does feel pressure when she voids, hx of UTI.

## 2017-12-23 NOTE — ED Notes (Signed)
Urine preg results entered incorrectly as POSITIVE by this RN; urine preg result is NEGATIVE.

## 2017-12-23 NOTE — Discharge Instructions (Signed)
Please take the hydrocodone 1 pill 4 times a day for the pain.  Be careful it can make you woozy and constipated do not drive on it.  use the antibiotic Cipro twice a day to treat the infection.  Please return if your worse get higher fever nausea worse pain or feel sicker or if you are not any better in the next 2 days.  Please follow-up with your regular doctor within the week.  Very rarely the Cipro will have the side effect of causing tendon weakness or swelling causing pain in your arms or legs or joints.  If this happens please stop the Cipro and come into the emergency room immediately

## 2017-12-23 NOTE — ED Provider Notes (Signed)
[facility] Emergency Department Provider Note   ____________________________________________   First MD Initiated Contact with Patient 12/23/17 [phone]     (approximate)  I have reviewed the triage vital signs and the nursing notes.   HISTORY  Chief Complaint Flank Pain   HPI Carly Singleton is a 21 y.o. female patient reports flank pain starting in the left flank at midnight radiating up into her left chest below the shoulder blade.  She also reports when she urinates she feels a hard pressure down below in her bladder when she finishes urinating pain seems to be worse with deep breathing she is not  short of breath.  She has no fever and she is not vomiting  Past Medical History:  Diagnosis Date  . Chlamydia   . Medical history non-contributory   . 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 Moderate single current episode of major depressive disorder (HCC) 10/23/2016    Past Surgical History:  Procedure Laterality Date  . NO PAST SURGERIES      Prior to Admission medications   Medication Sig Start Date End Date Taking? Authorizing Provider  ciprofloxacin (CIPRO) 500 MG tablet Take 1 tablet (500 mg total) by mouth 2 (two) times daily for 10 days. 12/23/17 01/02/18  Arnaldo NatalMalinda, Paul F, MD  Etonogestrel (NEXPLANON [place]) Inject into the skin.    [provider]  HYDROcodone-acetaminophen (NORCO/VICODIN) 5-325 MG tablet Take 1 tablet by mouth every 6 (six) hours as needed for moderate pain. 12/23/17 12/23/18  Arnaldo NatalMalinda, Paul F, MD  ketoconazole (NIZORAL) 2 % shampoo Apply 1 application topically 2 (two) times a week. 09/16/17   Johnson, Megan P, DO  naproxen (NAPROSYN) 500 MG tablet Take 1 tablet (500 mg total) by mouth 2 (two) times daily with a meal. 09/14/17   Johnson, Megan P, DO  ondansetron (ZOFRAN) 4 MG tablet Take 1 tablet (4 mg total) by mouth daily as  needed for nausea or vomiting. 07/03/17 07/03/18  Enid DerryWagner, Ashley, PA-C  valACYclovir (VALTREX) 1000 MG tablet Take 1 tablet (1,000 mg total) by mouth 2 (two) times daily. 06/29/17   Gabriel CirriWicker, Cheryl, NP    Allergies Azithromycin and Penicillin g  Family History  Problem Relation Age of Onset  . Down syndrome Daughter   . Cancer Neg Hx   . Diabetes Neg Hx   . Heart disease Neg Hx     Social History Social History   Tobacco Use  . Smoking status: Never Smoker  . Smokeless tobacco: Never Used  Substance Use Topics  . Alcohol use: No  . Drug use: No    Review of Systems  Constitutional: No fever/chills Eyes: No visual changes. ENT: No sore throat. Cardiovascular: Denies chest pain. Respiratory: Denies shortness of breath. Gastrointestinal:  see HPI Genitourinary: See HPI Musculoskeletal: See HPI Skin: Negative for rash. Neurological: Negative for headaches, focal weakness    ____________________________________________   PHYSICAL EXAM:  VITAL SIGNS: ED Triage Vitals  Enc Vitals Group     BP 12/23/17 0340 111/68     Pulse Rate 12/23/17 0340 74     Resp 12/23/17 0340 20     Temp 12/23/17 0340 99.6 F (37.6 C)     Temp Source 12/23/17 0340 Oral     SpO2 12/23/17 0340 98 %     Weight 12/23/17 0346 107 lb (48.5 kg)     Height 12/23/17  [id number] 5' (1.524 m)     Head Circumference --      Peak Flow --      Pain Score 12/23/17 0340 10     Pain Loc --      Pain Edu? --      Excl. in GC? --     Constitutional: Alert and oriented. Well appearing and in no acute distress. Eyes: Conjunctivae are normal. Head: Atraumatic. Nose: No congestion/rhinnorhea. Mouth/Throat: Mucous membranes are moist.  Oropharynx non-erythematous. Neck: No stridor. Cardiovascular: Normal rate, regular rhythm. Grossly normal heart sounds.  Good peripheral circulation. Respiratory: Normal respiratory effort.  No retractions. Lungs CTAB. Gastrointestinal: Soft and nontender. No distention. No  abdominal bruits.  Musculoskeletal: No lower extremity tenderness nor edema.  No joint effusions. Neurologic:  Normal speech and language.  No gross focal neurologic deficits are appreciated Skin:  Skin is warm, dry and intact. No rash noted. Psychiatric: Mood and affect are normal. Speech and behavior are normal.  ____________________________________________   LABS (all labs ordered are listed, but only abnormal results are displayed)  Labs Reviewed  COMPREHENSIVE METABOLIC PANEL - Abnormal; Notable for the following components:      Result Value   Sodium 133 (*)    Glucose, Bld 119 (*)    ALT 13 (*)    All other components within normal limits  CBC WITH DIFFERENTIAL/PLATELET - Abnormal; Notable for the following components:   WBC 13.4 (*)    Neutro Abs 11.3 (*)    All other components within normal limits  URINALYSIS, COMPLETE (UACMP) WITH MICROSCOPIC - Abnormal; Notable for the following components:   Color, Urine YELLOW (*)    APPearance HAZY (*)    Hgb urine dipstick LARGE (*)    Ketones, ur 5 (*)    Protein, ur 100 (*)    Leukocytes, UA LARGE (*)    Bacteria, UA RARE (*)    Squamous Epithelial / LPF 0-5 (*)    All other components within normal limits  POCT PREGNANCY, URINE - Abnormal; Notable for the following components:   Preg Test, Ur POSITIVE (*)    All other components within normal limits  URINE CULTURE  PREGNANCY, URINE   ____________________________________________  EKG   ____________________________________________  RADIOLOGY Chest x-ray is read as negative  ____________________________________________   PROCEDURES  Procedure(s) performed:   Procedures  Critical Care performed:   ____________________________________________   INITIAL IMPRESSION / ASSESSMENT AND PLAN / ED COURSE Note patient's urine pregnancy test is actually negative repeat negative. Patient's ultrasound of the kidneys looks normal there is no sign of obstruction with no  hydronephrosis and bilateral ureteral tests.  She has too numerous to count red and white cells in her and the flank pain which leads me to suspect she has pyelonephritis we will treat her for that and let her go.      ____________________________________________   FINAL CLINICAL IMPRESSION(S) / ED DIAGNOSES  Final diagnoses:  Pyelonephritis     ED Discharge Orders        Ordered    HYDROcodone-acetaminophen (NORCO/VICODIN) 5-325 MG tablet  Every 6 hours PRN     12/23/17 0521    ciprofloxacin (CIPRO) 500 MG tablet  2 times daily     12/23/17 [id number]       Note:  This document was prepared using Dragon voice recognition software and may include unintentional dictation errors.    Arnaldo NatalMalinda, Paul F, MD 12/23/17 [phone]

## 2017-12-25 LAB — URINE CULTURE: Culture: >=100000 — AB

## 2018-05-21 ENCOUNTER — Encounter: Payer: Self-pay | Admitting: Emergency Medicine

## 2018-05-21 ENCOUNTER — Other Ambulatory Visit: Payer: Self-pay

## 2018-05-21 DIAGNOSIS — R509 Fever, unspecified: Secondary | ICD-10-CM | POA: Insufficient documentation

## 2018-05-21 DIAGNOSIS — N12 Tubulo-interstitial nephritis, not specified as acute or chronic: Secondary | ICD-10-CM | POA: Insufficient documentation

## 2018-05-21 NOTE — ED Triage Notes (Signed)
Pt reports dysuria and mid abd pain for 8 days; went to [facility] 2 days ago and was told she may have a UTI; was put on macrobid which she's taking as prescribed; not feeling any better; temp 103 here tonight; also having pain to mid back; pt tearful in triage;

## 2018-05-22 ENCOUNTER — Emergency Department
Admission: EM | Admit: 2018-05-22 | Discharge: 2018-05-22 | Disposition: A | Discharge status: Home / Self Care | Payer: Self-pay | Attending: Emergency Medicine | Admitting: Emergency Medicine

## 2018-05-22 ENCOUNTER — Emergency Department: Payer: Self-pay

## 2018-05-22 DIAGNOSIS — R509 Fever, unspecified: Secondary | ICD-10-CM

## 2018-05-22 DIAGNOSIS — N12 Tubulo-interstitial nephritis, not specified as acute or chronic: Secondary | ICD-10-CM

## 2018-05-22 LAB — COMPREHENSIVE METABOLIC PANEL
ALBUMIN: 4.5 g/dL (ref 3.5–5.0)
ALT: 11 U/L — ABNORMAL LOW (ref 14–54)
ANION GAP: 8 (ref 5–15)
AST: 21 U/L (ref 15–41)
Alkaline Phosphatase: 56 U/L (ref 38–126)
BILIRUBIN TOTAL: 1 mg/dL (ref 0.3–1.2)
BUN: 8 mg/dL (ref 6–20)
CALCIUM: 9.1 mg/dL (ref 8.9–10.3)
CO2: 24 mmol/L (ref 22–32)
Chloride: 101 mmol/L (ref 101–111)
Creatinine, Ser: 0.7 mg/dL (ref 0.44–1.00)
GFR calc non Af Amer: >60 mL/min (ref >60)
GLUCOSE: 116 mg/dL — AB (ref 65–99)
POTASSIUM: 3.5 mmol/L (ref 3.5–5.1)
Sodium: 133 mmol/L — ABNORMAL LOW (ref 135–145)
TOTAL PROTEIN: 8.9 g/dL — AB (ref 6.5–8.1)

## 2018-05-22 LAB — URINALYSIS, COMPLETE (UACMP) WITH MICROSCOPIC
BILIRUBIN URINE: NEGATIVE
GLUCOSE, UA: NEGATIVE mg/dL
KETONES UR: 80 mg/dL — AB
NITRITE: NEGATIVE
PROTEIN: 30 mg/dL — AB
Specific Gravity, Urine: 1.013 (ref 1.005–1.030)
pH: 5 (ref 5.0–8.0)

## 2018-05-22 LAB — CBC
HEMATOCRIT: 38.1 % (ref 35.0–47.0)
HEMOGLOBIN: 13.3 g/dL (ref 12.0–16.0)
MCH: 29.4 pg (ref 26.0–34.0)
MCHC: 34.9 g/dL (ref 32.0–36.0)
MCV: 84.3 fL (ref 80.0–100.0)
Platelets: 378 10*3/uL (ref 150–440)
RBC: 4.51 MIL/uL (ref 3.80–5.20)
RDW: 13.5 % (ref 11.5–14.5)
WBC: 16.5 10*3/uL — AB (ref 3.6–11.0)

## 2018-05-22 LAB — LACTIC ACID, PLASMA: Lactic Acid, Venous: 0.9 mmol/L (ref 0.5–1.9)

## 2018-05-22 LAB — POCT PREGNANCY, URINE: Preg Test, Ur: NEGATIVE

## 2018-05-22 MED ORDER — ONDANSETRON 4 MG PO TBDP: 4.0000 mg | ORAL_TABLET | Freq: Three times a day (TID) | ORAL | 0 refills | Status: DC | PRN

## 2018-05-22 MED ORDER — CEPHALEXIN 500 MG PO CAPS: 500.0000 mg | ORAL_CAPSULE | Freq: Three times a day (TID) | ORAL | 0 refills | Status: DC

## 2018-05-22 MED ORDER — ACETAMINOPHEN 500 MG PO TABS
1000.0000 mg | ORAL_TABLET | Freq: Once | ORAL | Status: AC
  Administered 2018-05-22: 1000 mg via ORAL
  Filled 2018-05-22: qty 2

## 2018-05-22 MED ORDER — SODIUM CHLORIDE 0.9 % IV SOLN
1.0000 g | Freq: Once | INTRAVENOUS | Status: AC
  Administered 2018-05-22: 1 g via INTRAVENOUS
  Filled 2018-05-22: qty 10

## 2018-05-22 MED ORDER — ONDANSETRON HCL 4 MG/2ML IJ SOLN
4.0000 mg | Freq: Once | INTRAMUSCULAR | Status: AC | PRN
  Administered 2018-05-22: 4 mg via INTRAVENOUS
  Filled 2018-05-22: qty 2

## 2018-05-22 MED ORDER — IBUPROFEN 600 MG PO TABS: 600.0000 mg | ORAL_TABLET | Freq: Three times a day (TID) | ORAL | 0 refills | Status: DC | PRN

## 2018-05-22 MED ORDER — SODIUM CHLORIDE 0.9 % IV BOLUS
1000.0000 mL | Freq: Once | INTRAVENOUS | Status: AC
  Administered 2018-05-22: 1000 mL via INTRAVENOUS

## 2018-05-22 NOTE — ED Provider Notes (Signed)
[facility] Emergency Department Provider Note   ____________________________________________   First MD Initiated Contact with Patient 05/22/18 0530     (approximate)  I have reviewed the triage vital signs and the nursing notes.   HISTORY  Chief Complaint Dysuria and Abdominal Pain    HPI Carly Singleton is a 22 y.o. female who presents to the ED from home with a chief complaint of dysuria, suprapubic and back pain.  Patient has been having symptoms for the past 8 days.  2 days ago she was seen at [facility], diagnosed with UTI and placed on Macrobid.  States her symptoms worsened yesterday; complains of fever and chills in addition to the above complaints.  Denies chest pain, cough, shortness of breath, nausea, vomiting, diarrhea.  Denies recent travel or trauma.   Past Medical History:  Diagnosis Date  . Chlamydia   . Medical history non-contributory   . 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 Moderate single current episode of major depressive disorder (HCC) 10/23/2016    Past Surgical History:  Procedure Laterality Date  . NO PAST SURGERIES      Prior to Admission medications   Medication Sig Start Date End Date Taking? Authorizing Provider  Etonogestrel (NEXPLANON [place]) Inject into the skin.   Yes [provider]  nitrofurantoin, macrocrystal-monohydrate, (MACROBID) 100 MG capsule Take 100 mg by mouth 2 (two) times daily.   Yes [provider]    Allergies Azithromycin and Penicillin g  Family History  Problem Relation Age of Onset  . Down syndrome Daughter   . Cancer Neg Hx   . Diabetes Neg Hx   . Heart disease Neg Hx     Social History Social History   Tobacco Use  . Smoking status: Never Smoker  . Smokeless tobacco: Never Used  Substance Use Topics  . Alcohol use: No  . Drug use: No    Review of  Systems  Constitutional: Positive for fever/chills. Eyes: No visual changes. ENT: No sore throat. Cardiovascular: Denies chest pain. Respiratory: Denies shortness of breath. Gastrointestinal: Positive for abdominal pain.  No nausea, no vomiting.  No diarrhea.  No constipation. Genitourinary: Positive for dysuria. Musculoskeletal: Positive for back pain. Skin: Negative for rash. Neurological: Negative for headaches, focal weakness or numbness.   ____________________________________________   PHYSICAL EXAM:  VITAL SIGNS: ED Triage Vitals [05/21/18 2353]  Enc Vitals Group     BP 119/78     Pulse Rate 92     Resp 18     Temp (!) 103 F (39.4 C)     Temp Source Oral     SpO2 98 %     Weight 110 lb (49.9 kg)     Height  (1.549 m)     Head Circumference      Peak Flow      Pain Score 10     Pain Loc      Pain Edu?      Excl. in GC?     Constitutional: Alert and oriented. Well appearing and in no acute distress. Eyes: Conjunctivae are normal. PERRL. EOMI. Head: Atraumatic. Nose: No congestion/rhinnorhea. Mouth/Throat: Mucous membranes are moist.  Oropharynx non-erythematous. Neck: No stridor.   Cardiovascular: Normal rate, regular rhythm. Grossly normal heart sounds.  Good peripheral circulation. Respiratory: Normal respiratory effort.  No retractions. Lungs CTAB. Gastrointestinal: Soft and nontender to light or  deep palpation. No distention. No abdominal bruits. No CVA tenderness. Musculoskeletal: No lower extremity tenderness nor edema.  No joint effusions. Neurologic:  Normal speech and language. No gross focal neurologic deficits are appreciated. No gait instability. Skin:  Skin is warm, dry and intact. No rash noted. Psychiatric: Mood and affect are normal. Speech and behavior are normal.  ____________________________________________   LABS (all labs ordered are listed, but only abnormal results are displayed)  Labs Reviewed  COMPREHENSIVE METABOLIC PANEL  - Abnormal; Notable for the following components:      Result Value   Sodium 133 (*)    Glucose, Bld 116 (*)    Total Protein 8.9 (*)    ALT 11 (*)    All other components within normal limits  CBC - Abnormal; Notable for the following components:   WBC 16.5 (*)    All other components within normal limits  URINALYSIS, COMPLETE (UACMP) WITH MICROSCOPIC - Abnormal; Notable for the following components:   Color, Urine YELLOW (*)    APPearance HAZY (*)    Hgb urine dipstick LARGE (*)    Ketones, ur 80 (*)    Protein, ur 30 (*)    Leukocytes, UA MODERATE (*)    WBC, UA >50 (*)    Bacteria, UA RARE (*)    All other components within normal limits  LACTIC ACID, PLASMA  LACTIC ACID, PLASMA  POC URINE PREG, ED  POCT PREGNANCY, URINE   ____________________________________________  EKG  None ____________________________________________  RADIOLOGY  ED MD interpretation: No stones  Official radiology report(s): Ct Renal Stone Study  Result Date: 05/22/2018 CLINICAL DATA:  Dysuria and abdominal pain EXAM: CT ABDOMEN AND PELVIS WITHOUT CONTRAST TECHNIQUE: Multidetector CT imaging of the abdomen and pelvis was performed following the standard protocol without IV contrast. COMPARISON:  None. FINDINGS: LOWER CHEST: No basilar pulmonary nodules or pleural effusion. No apical pericardial effusion. HEPATOBILIARY: Normal hepatic contours and density. No intra- or extrahepatic biliary dilatation. Normal gallbladder. PANCREAS: Normal parenchymal contours without ductal dilatation. No peripancreatic fluid collection. SPLEEN: Normal. ADRENALS/URINARY TRACT: --Adrenal glands: Normal. --Right kidney/ureter: No hydronephrosis, nephroureterolithiasis, perinephric stranding or solid renal mass. --Left kidney/ureter: No hydronephrosis, nephroureterolithiasis, perinephric stranding or solid renal mass. --Urinary bladder: Normal for degree of distention STOMACH/BOWEL: --Stomach/Duodenum: No hiatal hernia or  other gastric abnormality. Normal duodenal course. --Small bowel: No dilatation or inflammation. --Colon: No focal abnormality. --Appendix: Normal. VASCULAR/LYMPHATIC: Normal course and caliber of the major abdominal vessels. No abdominal or pelvic lymphadenopathy. REPRODUCTIVE: Normal uterus and ovaries. MUSCULOSKELETAL. No bony spinal canal stenosis or focal osseous abnormality. OTHER: None. IMPRESSION: No obstructive uropathy, nephrolithiasis or other acute abdominal or pelvic abnormality. Electronically Signed   By: Deatra Robinson M.D.   On: 05/22/2018 02:09    ____________________________________________   PROCEDURES  Procedure(s) performed: None  Procedures  Critical Care performed: No  ____________________________________________   INITIAL IMPRESSION / ASSESSMENT AND PLAN / ED COURSE  As part of my medical decision making, I reviewed the following data within the electronic MEDICAL RECORD NUMBER Nursing notes reviewed and incorporated, Labs reviewed, Old chart reviewed and Notes from prior ED visits   22 year old female on Macrobid who presents with fever, suprapubic abdominal pain and lower back pain. Differential diagnosis includes, but is not limited to, ovarian cyst, ovarian torsion, acute appendicitis, diverticulitis, urinary tract infection/pyelonephritis, endometriosis, bowel obstruction, colitis, renal colic, gastroenteritis, hernia, fibroids, endometriosis, pregnancy related pain including ectopic pregnancy, etc.   Patient had labs, urine and CT renal colic study done while  awaiting treatment room.  There is a remarkable for moderate leukocytosis, UTI, no obstructive stones.  Clinically patient's symptoms are consistent with pyelonephritis.  Will check lactate, initiate IV fluid resuscitation, IV Rocephin and reassess.  Currently patient denies pain or nausea.   Clinical Course as of May 22 717  Wynelle Link May 22, 2018  9604 Lactate is unremarkable.  Will administer IV Rocephin.   Anticipate discharge home on Keflex.  I have instructed patient to discontinue Macrobid.  Strict return precautions given.  Patient verbalizes understanding and agrees with plan of care.   [JS]    Clinical Course User Index [JS] Irean Hong, MD     ____________________________________________   FINAL CLINICAL IMPRESSION(S) / ED DIAGNOSES  Final diagnoses:  Fever, unspecified fever cause  Pyelonephritis     ED Discharge Orders    None       Note:  This document was prepared using Dragon voice recognition software and may include unintentional dictation errors.    Irean Hong, MD 05/22/18 [phone]

## 2018-05-22 NOTE — Discharge Instructions (Signed)
1.  Discontinue Macrobid.  Instead take Keflex 500 mg 3 times daily for 7 days. 2.  You may take pain and nausea medicines as needed (Motrin/Zofran #15). 3.  Drink plenty of fluids daily. 4.  Return to the ER for worsening symptoms, persistent vomiting, difficulty breathing or other concerns.

## 2018-06-26 IMAGING — CT CT RENAL STONE PROTOCOL
3 of 4 series · 9 of 46 positions shown, 14 images · non-contrast
Comparison: None.

CLINICAL DATA: Dysuria and abdominal pain

EXAM:
CT ABDOMEN AND PELVIS WITHOUT CONTRAST
TECHNIQUE: Multidetector CT imaging of the abdomen and pelvis was performed
following the standard protocol without IV contrast.

[Series 4: lung bases · axial · 0.63mm/px · z∈[-598,-518]mm · 5 of 24 slices shown, 10 images]
[im 4/24  soft-tissue]
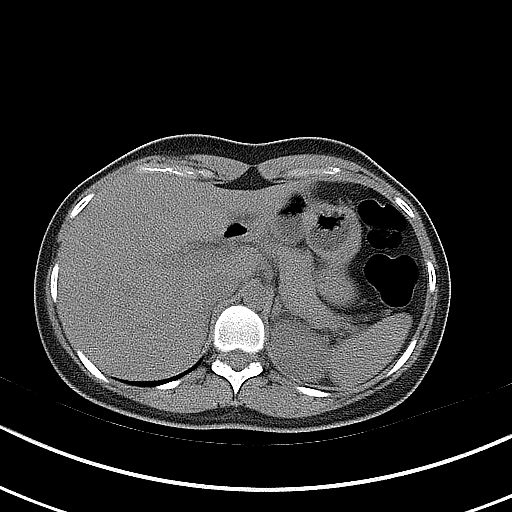
[im 4/24  bone]
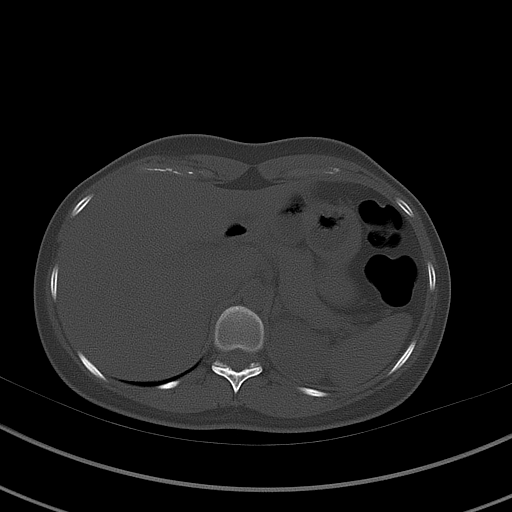
[im 8/24  soft-tissue]
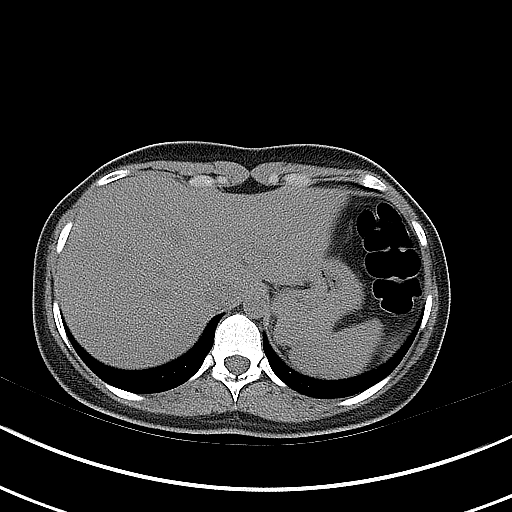
[im 8/24  lung]
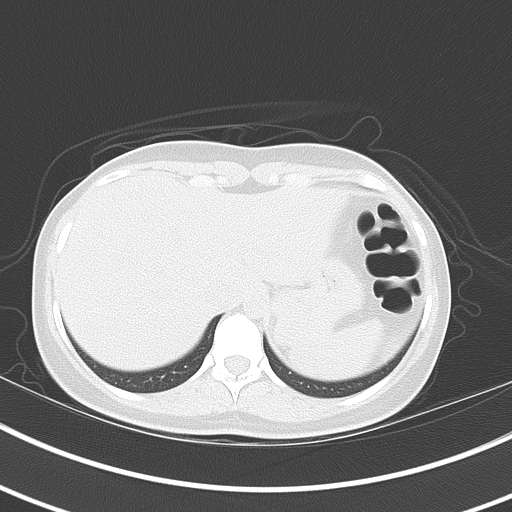
[im 12/24  soft-tissue]
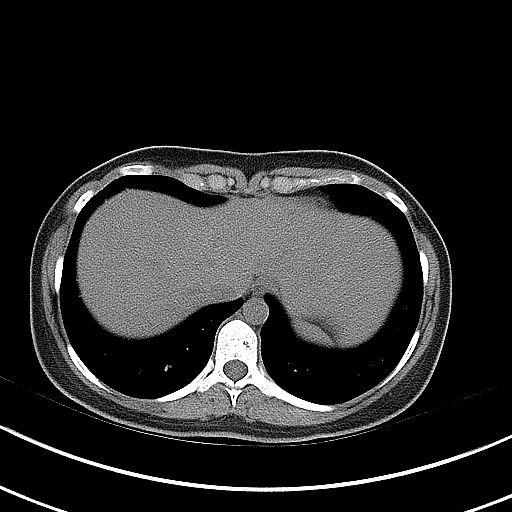
[im 12/24  lung]
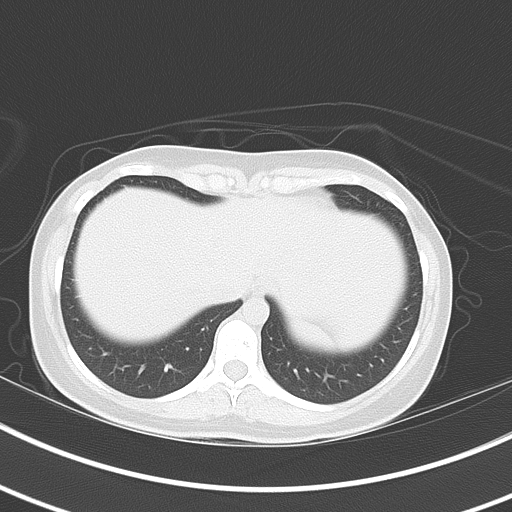
[im 16/24  soft-tissue]
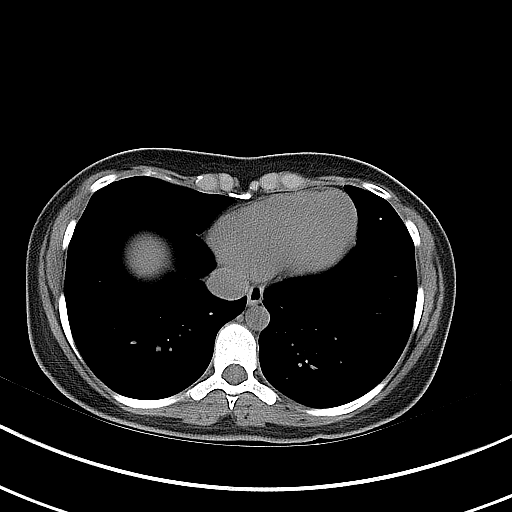
[im 16/24  lung]
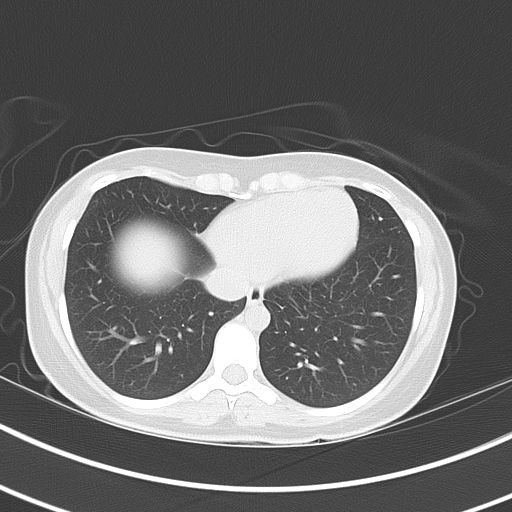
[im 20/24  soft-tissue]
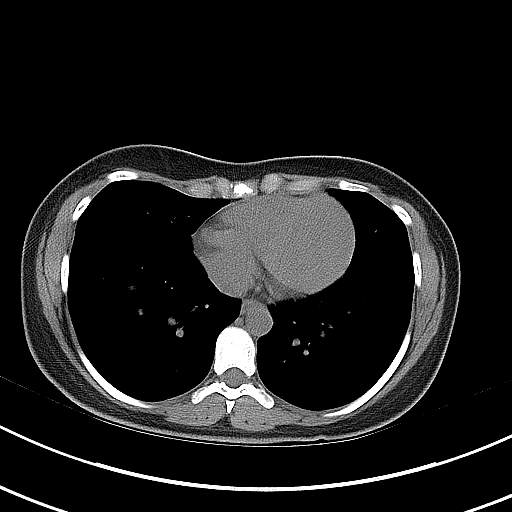
[im 20/24  lung]
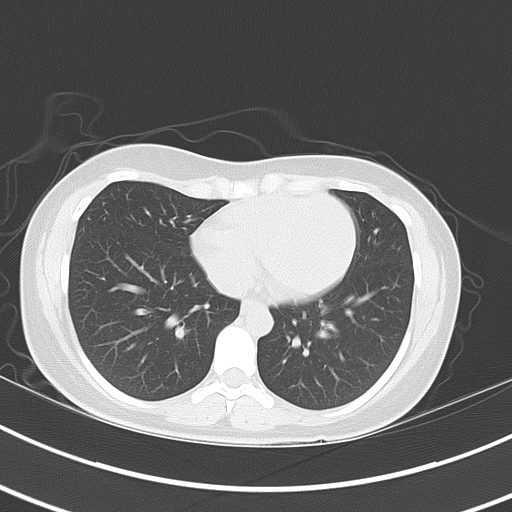

[Series 5: coronal · coronal · 0.62mm/px · 3 of 95 slices shown]
[im 32/95  soft-tissue]
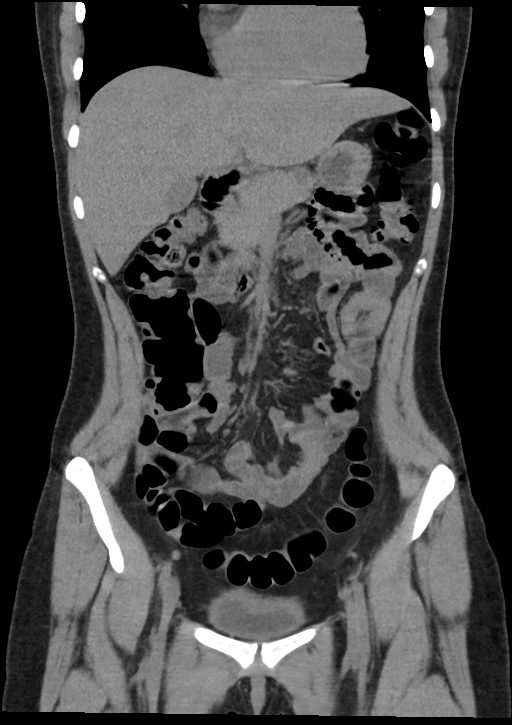
[im 42/95  soft-tissue]
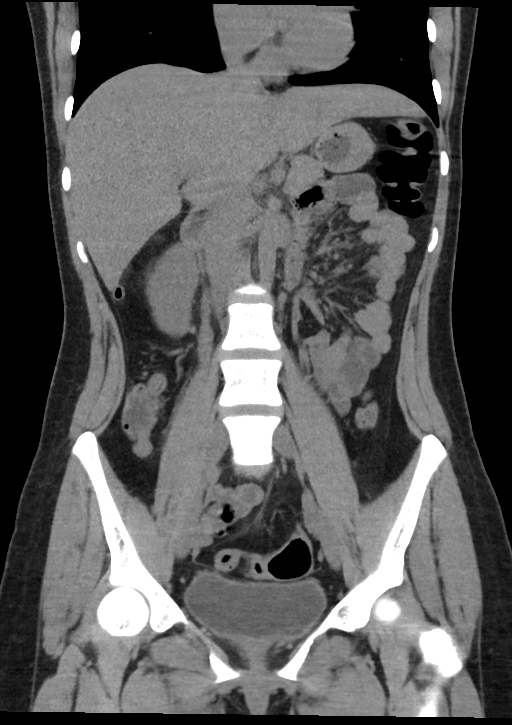
[im 53/95  soft-tissue]
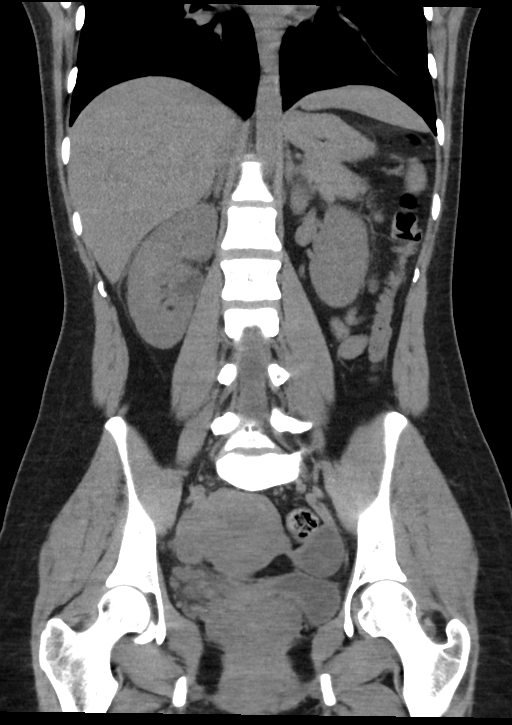

[Series 6: sagittal · sagittal · 0.37mm/px · 1 of 160 slices shown]
[im 54/160  soft-tissue]
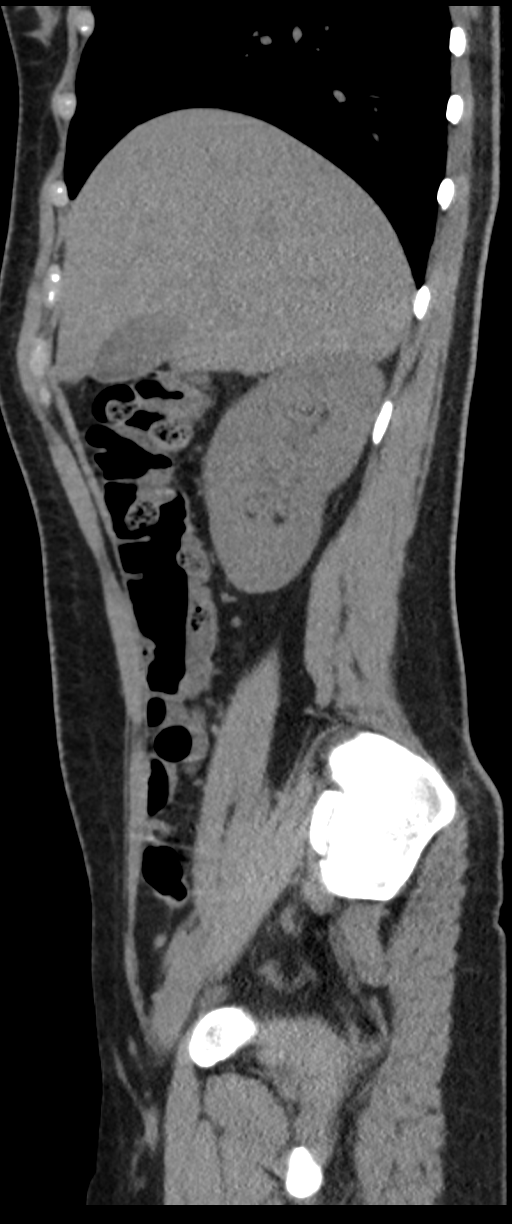

[9 of 46 positions shown; findings below may reference images not displayed]

FINDINGS: LOWER CHEST: No basilar pulmonary nodules or pleural effusion. No
apical pericardial effusion.

HEPATOBILIARY: Normal hepatic contours and density. No intra- or
extrahepatic biliary dilatation. Normal gallbladder.

PANCREAS: Normal parenchymal contours without ductal dilatation. No
peripancreatic fluid collection.

SPLEEN: Normal.

ADRENALS/URINARY TRACT:

--Adrenal glands: Normal.

--Right kidney/ureter: No hydronephrosis, nephroureterolithiasis,
perinephric stranding or solid renal mass.

--Left kidney/ureter: No hydronephrosis, nephroureterolithiasis,
perinephric stranding or solid renal mass.

--Urinary bladder: Normal for degree of distention

STOMACH/BOWEL:

--Stomach/Duodenum: No hiatal hernia or other gastric abnormality.
Normal duodenal course.

--Small bowel: No dilatation or inflammation.

--Colon: No focal abnormality.

--Appendix: Normal.

VASCULAR/LYMPHATIC: Normal course and caliber of the major abdominal
vessels. No abdominal or pelvic lymphadenopathy.

REPRODUCTIVE: Normal uterus and ovaries.

MUSCULOSKELETAL. No bony spinal canal stenosis or focal osseous
abnormality.

OTHER: None.
IMPRESSION: No obstructive uropathy, nephrolithiasis or other acute abdominal or
pelvic abnormality.

## 2019-03-22 ENCOUNTER — Telehealth: Payer: Self-pay

## 2019-03-22 NOTE — Telephone Encounter (Signed)
Pt called number busy.  

## 2019-03-23 ENCOUNTER — Telehealth: Payer: Self-pay

## 2019-03-23 NOTE — Telephone Encounter (Signed)
Pt called no answer and unable to leave a message do it the number being busy.

## 2019-03-27 ENCOUNTER — Ambulatory Visit: Payer: Self-pay | Admitting: Obstetrics and Gynecology

## 2019-03-28 ENCOUNTER — Telehealth: Payer: Self-pay

## 2019-03-28 NOTE — Telephone Encounter (Signed)
Pt called no answer LM via voicemail to call the office to go over prescreening questions before her visit to the office tomorrow. Pt was advised to please call the office as soon as possible.

## 2019-03-28 NOTE — Progress Notes (Signed)
Pt is present today for removal of Nexplanon. Pt's Nexplanon was inserted on 12/31/2015. Pt stated that she would like to start taking OCP.

## 2019-03-29 ENCOUNTER — Ambulatory Visit: Payer: Self-pay | Admitting: Obstetrics and Gynecology

## 2019-03-29 ENCOUNTER — Encounter: Payer: Self-pay | Admitting: Obstetrics and Gynecology

## 2019-03-29 ENCOUNTER — Other Ambulatory Visit (HOSPITAL_COMMUNITY)
Admission: RE | Admit: 2019-03-29 | Discharge: 2019-03-29 | Disposition: A | Discharge status: Home / Self Care | Payer: Medicaid Other | Source: Ambulatory Visit | Attending: Obstetrics and Gynecology | Admitting: Obstetrics and Gynecology

## 2019-03-29 ENCOUNTER — Ambulatory Visit (INDEPENDENT_AMBULATORY_CARE_PROVIDER_SITE_OTHER): Payer: Self-pay | Admitting: Obstetrics and Gynecology

## 2019-03-29 ENCOUNTER — Other Ambulatory Visit: Payer: Self-pay

## 2019-03-29 VITALS — BP 106/69 | HR 61 | Ht 61.0 in | Wt 107.9 lb

## 2019-03-29 DIAGNOSIS — Z3046 Encounter for surveillance of implantable subdermal contraceptive: Secondary | ICD-10-CM

## 2019-03-29 DIAGNOSIS — Z30011 Encounter for initial prescription of contraceptive pills: Secondary | ICD-10-CM

## 2019-03-29 DIAGNOSIS — Z124 Encounter for screening for malignant neoplasm of cervix: Secondary | ICD-10-CM | POA: Insufficient documentation

## 2019-03-29 MED ORDER — LEVONORGEST-ETH ESTRAD 91-DAY 0.1-0.02 & 0.01 MG PO TABS: 1.0000 | ORAL_TABLET | Freq: Every day | ORAL | 4 refills | Status: DC

## 2019-03-29 NOTE — Progress Notes (Signed)
     GYNECOLOGY OFFICE PROCEDURE NOTE  Carly Singleton is a 23 y.o. G2P2001 here for Nexplanon removal. Patient has never had a pap smear. No other gynecologic concerns.  Nexplanon Removal Patient identified, informed consent performed, consent signed.   Appropriate time out taken. Nexplanon site identified.  Area prepped in usual sterile fashon. One ml of 1% lidocaine was used to anesthetize the area at the distal end of the implant. A small stab incision was made right beside the implant on the distal portion.  The Nexplanon rod was grasped using hemostats and removed without difficulty.  There was minimal blood loss. There were no complications.  3 ml of 1% lidocaine was injected around the incision for post-procedure analgesia.  Steri-strips were applied over the small incision.  A pressure bandage was applied to reduce any bruising.  The patient tolerated the procedure well and was given post procedure instructions.  Patient is planning to use combined OCPs for contraception. Given prescription. Advised on back-up method for contraception x 2 weeks.  Pap smear performed today.  Patient states that she has a PCP, with whom she has regular follow up.     Hildred Laser, MD [facility]

## 2019-03-29 NOTE — Addendum Note (Signed)
Addended by: Silvano Bilis on: 03/29/2019 09:22 AM   Modules accepted: Orders

## 2019-03-29 NOTE — Patient Instructions (Signed)
NEXPLANON REMOVAL POST-PROCEDURE INSTRUCTIONS  1. You may take Ibuprofen, Aleve or Tylenol for pain if needed.  Pain should resolve within in 24 hours.  2. You may have intercourse after 24 hours. Use a backup method for 2 weeks. Begin Sunday start of pills.   3. You need to call if you have any fever, heavy bleeding, or redness at insertion site. Irregular bleeding is common the first several months after having a Nexplanonplaced. You do not need to call for this reason unless you are concerned.  4. Shower or bathe as normal.  You can remove the bandage after 24 hours.

## 2019-03-30 LAB — CYTOLOGY - PAP
Chlamydia: NEGATIVE
Diagnosis: NEGATIVE
Neisseria Gonorrhea: NEGATIVE
Trichomonas: NEGATIVE

## 2019-09-04 ENCOUNTER — Encounter: Payer: Self-pay | Admitting: Family Medicine

## 2019-09-12 ENCOUNTER — Encounter: Payer: Self-pay | Admitting: Obstetrics and Gynecology

## 2019-10-02 ENCOUNTER — Encounter: Payer: Self-pay | Admitting: Family Medicine

## 2019-10-03 ENCOUNTER — Other Ambulatory Visit: Payer: Self-pay

## 2019-10-03 ENCOUNTER — Encounter: Payer: Self-pay | Admitting: Obstetrics and Gynecology

## 2019-10-03 ENCOUNTER — Ambulatory Visit (INDEPENDENT_AMBULATORY_CARE_PROVIDER_SITE_OTHER): Payer: Medicaid Other | Admitting: Obstetrics and Gynecology

## 2019-10-03 VITALS — BP 95/58 | HR 56 | Ht 61.0 in | Wt 109.7 lb

## 2019-10-03 DIAGNOSIS — Z3202 Encounter for pregnancy test, result negative: Secondary | ICD-10-CM | POA: Diagnosis not present

## 2019-10-03 DIAGNOSIS — Z3046 Encounter for surveillance of implantable subdermal contraceptive: Secondary | ICD-10-CM

## 2019-10-03 LAB — POCT URINE PREGNANCY: Preg Test, Ur: NEGATIVE

## 2019-10-03 MED ORDER — ETONOGESTREL 68 MG ~~LOC~~ IMPL: 68.0000 mg | DRUG_IMPLANT | Freq: Once | SUBCUTANEOUS | Status: DC

## 2019-10-03 NOTE — Progress Notes (Signed)
    GYNECOLOGY OFFICE PROCEDURE NOTE  Carly Singleton is a 23 y.o. G2P2001 here for Nexplanon insertion.  Last pap smear was on 03/29/2019 and was normal.  No other gynecologic concerns.  Nexplanon Insertion Procedure Patient identified, informed consent performed, consent signed.   Patient does understand that irregular bleeding is a very common side effect of this medication. She was advised to have backup contraception for one week after placement. Pregnancy test in clinic today was negative.  Appropriate time out taken.  Patient's left arm was prepped and draped in the usual sterile fashion. The ruler used to measure and mark insertion area.  Patient was prepped with alcohol swab and then injected with 3 ml of 1% lidocaine.  She was prepped with betadine, Nexplanon removed from packaging,  Device confirmed in needle, then inserted full length of needle and withdrawn per handbook instructions. Nexplanon was able to palpated in the patient's arm; patient palpated the insert herself. There was minimal blood loss.  Patient insertion site covered with guaze and a pressure bandage to reduce any bruising.  The patient tolerated the procedure well and was given post procedure instructions.    Lot: [id number] Exp: 11/06/2021

## 2019-10-16 ENCOUNTER — Ambulatory Visit (INDEPENDENT_AMBULATORY_CARE_PROVIDER_SITE_OTHER): Payer: Medicaid Other | Admitting: Family Medicine

## 2019-10-16 ENCOUNTER — Other Ambulatory Visit: Payer: Self-pay

## 2019-10-16 ENCOUNTER — Encounter: Payer: Self-pay | Admitting: Family Medicine

## 2019-10-16 VITALS — BP 95/68 | HR 58 | Temp 98.9°F | Ht 61.0 in | Wt 107.0 lb

## 2019-10-16 DIAGNOSIS — R102 Pelvic and perineal pain unspecified side: Secondary | ICD-10-CM

## 2019-10-16 DIAGNOSIS — N760 Acute vaginitis: Secondary | ICD-10-CM

## 2019-10-16 DIAGNOSIS — Z0000 Encounter for general adult medical examination without abnormal findings: Secondary | ICD-10-CM

## 2019-10-16 DIAGNOSIS — B9689 Other specified bacterial agents as the cause of diseases classified elsewhere: Secondary | ICD-10-CM | POA: Diagnosis not present

## 2019-10-16 LAB — WET PREP FOR TRICH, YEAST, CLUE
Clue Cell Exam: POSITIVE — AB
Trichomonas Exam: NEGATIVE
Yeast Exam: NEGATIVE

## 2019-10-16 MED ORDER — METRONIDAZOLE 500 MG PO TABS: 500.0000 mg | ORAL_TABLET | Freq: Two times a day (BID) | ORAL | 0 refills | Status: DC

## 2019-10-16 NOTE — Progress Notes (Signed)
BP 95/68   Pulse (!) 58   Temp 98.9 F (37.2 C) (Oral)   Ht 5\' 1"  (1.549 m)   Wt 107 lb (48.5 kg)   SpO2 98%   BMI 20.22 kg/m    Subjective:    Patient ID: Carly Singleton, female    DOB: 1996-03-14, 23 y.o.   MRN: [id number]  HPI: Carly Singleton is a 23 y.o. female presenting on 10/16/2019 for comprehensive medical examination. Current medical complaints include:  Has been having some pain in her vaginal area for about a month.  Duration: about a month Discharge description: clear  Pruritus: yes Dysuria: yes Malodorous: no Urinary frequency: no Fevers: no Abdominal pain: no  Sexual activity: monogamous History of sexually transmitted diseases: yes Recent antibiotic use: no Context: stable  Treatments attempted: none  She currently lives with: significant other and 2 kids Menopausal Symptoms: no  Depression Screen done today and results listed below:  Depression screen [facility] 2/9 10/16/2019 06/25/2017 12/24/2016 11/23/2016 10/23/2016  Decreased Interest 2 0 0 1 1  Down, Depressed, Hopeless 3 0 0 1 2  PHQ - 2 Score 5 0 0 2 3  Altered sleeping 2 0 0 0 1  Tired, decreased energy 3 0 0 3 2  Change in appetite 3 2 0 1 3  Feeling bad or failure about yourself  3 0 0 1 1  Trouble concentrating 1 0 0 0 1  Moving slowly or fidgety/restless 2 0 0 1 0  Suicidal thoughts 0 0 0 0 0  PHQ-9 Score 19 2 0 8 11  Difficult doing work/chores Somewhat difficult - - - -    Past Medical History:  Past Medical History:  Diagnosis Date  . Chlamydia   . Medical history non-contributory   . 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 NO PAST SURGERIES      Medications:  Current Outpatient Medications on File Prior to Visit  Medication Sig  . etonogestrel (NEXPLANON) 68 MG IMPL implant 1 each by Subdermal route once.   No current facility-administered medications  on file prior to visit.     Allergies:  Allergies  Allergen Reactions  . Azithromycin Itching and Rash    Only seen with IV usage not po medication.  Marland Kitchen Penicillin G Rash    Social History:  Social History   Socioeconomic History  . Marital status: Single    Spouse name: Not on file  . Number of children: Not on file  . Years of education: Not on file  . Highest education level: Not on file  Occupational History  . Not on file  Social Needs  . Financial resource strain: Not on file  . Food insecurity    Worry: Not on file    Inability: Not on file  . Transportation needs    Medical: Not on file    Non-medical: Not on file  Tobacco Use  . Smoking status: Never Smoker  . Smokeless tobacco: Never Used  Substance and Sexual Activity  . Alcohol use: Yes    Comment: monthly  . Drug use: Yes    Types: Marijuana  . Sexual activity: Yes    Birth control/protection: Condom  Lifestyle  . Physical activity    Days per week: Not on file    Minutes per session: Not on file  . Stress: Not on file  Relationships  .  Social Musician on phone: Not on file    Gets together: Not on file    Attends religious service: Not on file    Active member of club or organization: Not on file    Attends meetings of clubs or organizations: Not on file    Relationship status: Not on file  . Intimate partner violence    Fear of current or ex partner: Not on file    Emotionally abused: Not on file    Physically abused: Not on file    Forced sexual activity: Not on file  Other Topics Concern  . Not on file  Social History Narrative  . Not on file   Social History   Tobacco Use  Smoking Status Never Smoker  Smokeless Tobacco Never Used   Social History   Substance and Sexual Activity  Alcohol Use Yes   Comment: monthly    Family History:  Family History  Problem Relation Age of Onset  . Healthy Mother   . Healthy Father   . Down syndrome Daughter   . Cancer Neg  Hx   . Diabetes Neg Hx   . Heart disease Neg Hx     Past medical history, surgical history, medications, allergies, family history and social history reviewed with patient today and changes made to appropriate areas of the chart.   Review of Systems  Constitutional: Negative.   HENT: Negative.   Eyes: Negative.   Respiratory: Negative.   Cardiovascular: Negative.   Gastrointestinal: Positive for heartburn. Negative for abdominal pain, blood in stool, constipation, diarrhea, melena, nausea and vomiting.  Genitourinary: Negative.        Vaginal pain  Musculoskeletal: Negative.   Skin: Negative.   Neurological: Negative.   Endo/Heme/Allergies: Negative for environmental allergies and polydipsia. Bruises/bleeds easily.  Psychiatric/Behavioral: Positive for depression. Negative for hallucinations, memory loss, substance abuse and suicidal ideas. The patient is nervous/anxious. The patient does not have insomnia.     All other ROS negative except what is listed above and in the HPI.      Objective:    BP 95/68   Pulse (!) 58   Temp 98.9 F (37.2 C) (Oral)   Ht 5\' 1"  (1.549 m)   Wt 107 lb (48.5 kg)   SpO2 98%   BMI 20.22 kg/m   Wt Readings from Last 3 Encounters:  10/16/19 107 lb (48.5 kg)  10/03/19 109 lb 11.2 oz (49.8 kg)  03/29/19 107 lb 14.4 oz (48.9 kg)    Physical Exam Vitals signs and nursing note reviewed.  Constitutional:      General: She is not in acute distress.    Appearance: Normal appearance. She is not ill-appearing, toxic-appearing or diaphoretic.  HENT:     Head: Normocephalic and atraumatic.     Right Ear: Tympanic membrane, ear canal and external ear normal. There is no impacted cerumen.     Left Ear: Tympanic membrane, ear canal and external ear normal. There is no impacted cerumen.     Nose: Nose normal. No congestion or rhinorrhea.     Mouth/Throat:     Mouth: Mucous membranes are moist.     Pharynx: Oropharynx is clear. No oropharyngeal exudate  or posterior oropharyngeal erythema.  Eyes:     General: No scleral icterus.       Right eye: No discharge.        Left eye: No discharge.     Extraocular Movements: Extraocular movements intact.  Conjunctiva/sclera: Conjunctivae normal.     Pupils: Pupils are equal, round, and reactive to light.  Neck:     Musculoskeletal: Normal range of motion and neck supple. No neck rigidity or muscular tenderness.     Vascular: No carotid bruit.  Cardiovascular:     Rate and Rhythm: Normal rate and regular rhythm.     Pulses: Normal pulses.     Heart sounds: No murmur. No friction rub. No gallop.   Pulmonary:     Effort: Pulmonary effort is normal. No respiratory distress.     Breath sounds: Normal breath sounds. No stridor. No wheezing, rhonchi or rales.  Chest:     Chest wall: No tenderness.  Abdominal:     General: Abdomen is flat. Bowel sounds are normal. There is no distension.     Palpations: Abdomen is soft. There is no mass.     Tenderness: There is no abdominal tenderness. There is no right CVA tenderness, left CVA tenderness, guarding or rebound.     Hernia: No hernia is present.  Genitourinary:    Comments: Breast and pelvic exams deferred with shared decision making Musculoskeletal:        General: No swelling, tenderness, deformity or signs of injury.     Right lower leg: No edema.     Left lower leg: No edema.  Lymphadenopathy:     Cervical: No cervical adenopathy.  Skin:    General: Skin is warm and dry.     Capillary Refill: Capillary refill takes less than 2 seconds.     Coloration: Skin is not jaundiced or pale.     Findings: No bruising, erythema, lesion or rash.  Neurological:     General: No focal deficit present.     Mental Status: She is alert and oriented to person, place, and time. Mental status is at baseline.     Cranial Nerves: No cranial nerve deficit.     Sensory: No sensory deficit.     Motor: No weakness.     Coordination: Coordination normal.      Gait: Gait normal.     Deep Tendon Reflexes: Reflexes normal.  Psychiatric:        Mood and Affect: Mood normal.        Behavior: Behavior normal.        Thought Content: Thought content normal.        Judgment: Judgment normal.     Results for orders placed or performed in visit on 10/03/19  POCT urine pregnancy  Result Value Ref Range   Preg Test, Ur Negative Negative      Assessment & Plan:   Problem List Items Addressed This Visit    None    Visit Diagnoses    Routine general medical examination at a health care facility    -  Primary   Vaccines up to date. Screening labs checked today. Pap up to date. Continue diet and exercise. Call with any concerns.    Relevant Orders   CBC with Differential/Platelet   Comprehensive metabolic panel   Lipid Panel w/o Chol/HDL Ratio   TSH   UA/M w/rflx Culture, Routine   HIV Antibody (routine testing w rflx)   GC/Chlamydia Probe Amp   Hepatitis, Acute   RPR   HSV(herpes simplex vrs) 1+2 ab-IgG   Vaginal pain       + clue cells-  will treat with flagyl. Call with any concerns.    Relevant Orders   WET PREP FOR TRICH, YEAST,  CLUE   BV (bacterial vaginosis)       Will treat with flagyl. Call with any concerns. Continue to monitor.    Relevant Medications   metroNIDAZOLE (FLAGYL) 500 MG tablet       Follow up plan: Return ASAP, for Mood.  LABORATORY TESTING:  - Pap smear: up to date  IMMUNIZATIONS:   - Tdap: Tetanus vaccination status reviewed: last tetanus booster within 10 years. - Influenza: Refused - HPV: Up to date  PATIENT COUNSELING:   Advised to take 1 mg of folate supplement per day if capable of pregnancy.   Sexuality: Discussed sexually transmitted diseases, partner selection, use of condoms, avoidance of unintended pregnancy  and contraceptive alternatives.   Advised to avoid cigarette smoking.  I discussed with the patient that most people either abstain from alcohol or drink within safe limits (<=14/week  and <=4 drinks/occasion for males, <=7/weeks and <= 3 drinks/occasion for females) and that the risk for alcohol disorders and other health effects rises proportionally with the number of drinks per week and how often a drinker exceeds daily limits.  Discussed cessation/primary prevention of drug use and availability of treatment for abuse.   Diet: Encouraged to adjust caloric intake to maintain  or achieve ideal body weight, to reduce intake of dietary saturated fat and total fat, to limit sodium intake by avoiding high sodium foods and not adding table salt, and to maintain adequate dietary potassium and calcium preferably from fresh fruits, vegetables, and low-fat dairy products.    stressed the importance of regular exercise  Injury prevention: Discussed safety belts, safety helmets, smoke detector, smoking near bedding or upholstery.   Dental health: Discussed importance of regular tooth brushing, flossing, and dental visits.    NEXT PREVENTATIVE PHYSICAL DUE IN 1 YEAR. Return ASAP, for Mood.

## 2019-10-17 LAB — CBC WITH DIFFERENTIAL/PLATELET
Basophils Absolute: 0.1 10*3/uL (ref 0.0–0.2)
Basos: 1 %
EOS (ABSOLUTE): 0.1 10*3/uL (ref 0.0–0.4)
Eos: 1 %
Hematocrit: 39 % (ref 34.0–46.6)
Hemoglobin: 12.4 g/dL (ref 11.1–15.9)
Immature Grans (Abs): 0 10*3/uL (ref 0.0–0.1)
Immature Granulocytes: 0 %
Lymphocytes Absolute: 2.3 10*3/uL (ref 0.7–3.1)
Lymphs: 35 %
MCH: 28.5 pg (ref 26.6–33.0)
MCHC: 31.8 g/dL (ref 31.5–35.7)
MCV: 90 fL (ref 79–97)
Monocytes Absolute: 0.3 10*3/uL (ref 0.1–0.9)
Monocytes: 4 %
Neutrophils Absolute: 3.9 10*3/uL (ref 1.4–7.0)
Neutrophils: 59 %
Platelets: 372 10*3/uL (ref 150–450)
RBC: 4.35 x10E6/uL (ref 3.77–5.28)
RDW: 13.3 % (ref 11.7–15.4)
WBC: 6.6 10*3/uL (ref 3.4–10.8)

## 2019-10-17 LAB — COMPREHENSIVE METABOLIC PANEL
ALT: 39 IU/L — ABNORMAL HIGH (ref 0–32)
AST: 51 IU/L — ABNORMAL HIGH (ref 0–40)
Albumin/Globulin Ratio: 1.6 (ref 1.2–2.2)
Albumin: 4.9 g/dL (ref 3.9–5.0)
Alkaline Phosphatase: 53 IU/L (ref 39–117)
BUN/Creatinine Ratio: 20 (ref 9–23)
BUN: 12 mg/dL (ref 6–20)
Bilirubin Total: 0.5 mg/dL (ref 0.0–1.2)
CO2: 19 mmol/L — ABNORMAL LOW (ref 20–29)
Calcium: 9.5 mg/dL (ref 8.7–10.2)
Chloride: 106 mmol/L (ref 96–106)
Creatinine, Ser: 0.6 mg/dL (ref 0.57–1.00)
GFR calc Af Amer: 151 mL/min/{1.73_m2} (ref >59)
GFR calc non Af Amer: 131 mL/min/{1.73_m2} (ref >59)
Globulin, Total: 3.1 g/dL (ref 1.5–4.5)
Glucose: 86 mg/dL (ref 65–99)
Potassium: 4.1 mmol/L (ref 3.5–5.2)
Sodium: 139 mmol/L (ref 134–144)
Total Protein: 8 g/dL (ref 6.0–8.5)

## 2019-10-17 LAB — LIPID PANEL W/O CHOL/HDL RATIO
Cholesterol, Total: 175 mg/dL (ref 100–199)
HDL: 55 mg/dL (ref >39)
LDL Chol Calc (NIH): 108 mg/dL — ABNORMAL HIGH (ref 0–99)
Triglycerides: 62 mg/dL (ref 0–149)
VLDL Cholesterol Cal: 12 mg/dL (ref 5–40)

## 2019-10-17 LAB — HEPATITIS PANEL, ACUTE
Hep A IgM: NEGATIVE
Hep B C IgM: NEGATIVE
Hep C Virus Ab: <0.1 s/co ratio (ref 0.0–0.9)
Hepatitis B Surface Ag: NEGATIVE

## 2019-10-17 LAB — RPR: RPR Ser Ql: NONREACTIVE

## 2019-10-17 LAB — HIV ANTIBODY (ROUTINE TESTING W REFLEX): HIV Screen 4th Generation wRfx: NONREACTIVE

## 2019-10-17 LAB — GC/CHLAMYDIA PROBE AMP
Chlamydia trachomatis, NAA: NEGATIVE
Neisseria Gonorrhoeae by PCR: NEGATIVE

## 2019-10-17 LAB — HSV(HERPES SIMPLEX VRS) I + II AB-IGG
HSV 1 Glycoprotein G Ab, IgG: 32.7 index — ABNORMAL HIGH (ref 0.00–0.90)
HSV 2 IgG, Type Spec: 1.39 index — ABNORMAL HIGH (ref 0.00–0.90)

## 2019-10-17 LAB — TSH: TSH: 0.868 u[IU]/mL (ref 0.450–4.500)

## 2019-10-17 LAB — HSV-2 IGG SUPPLEMENTAL TEST: HSV-2 IgG Supplemental Test: POSITIVE — AB

## 2019-10-18 LAB — UA/M W/RFLX CULTURE, ROUTINE
Bilirubin, UA: NEGATIVE
Glucose, UA: NEGATIVE
Ketones, UA: NEGATIVE
Nitrite, UA: NEGATIVE
Specific Gravity, UA: 1.025 (ref 1.005–1.030)
Urobilinogen, Ur: 0.2 mg/dL (ref 0.2–1.0)
pH, UA: 5.5 (ref 5.0–7.5)

## 2019-10-18 LAB — MICROSCOPIC EXAMINATION

## 2019-10-18 LAB — URINE CULTURE, REFLEX: Organism ID, Bacteria: NO GROWTH

## 2019-10-31 ENCOUNTER — Ambulatory Visit (INDEPENDENT_AMBULATORY_CARE_PROVIDER_SITE_OTHER): Payer: Medicaid Other | Admitting: Family Medicine

## 2019-10-31 ENCOUNTER — Other Ambulatory Visit: Payer: Self-pay

## 2019-10-31 ENCOUNTER — Encounter: Payer: Self-pay | Admitting: Family Medicine

## 2019-10-31 DIAGNOSIS — F331 Major depressive disorder, recurrent, moderate: Secondary | ICD-10-CM

## 2019-10-31 MED ORDER — SERTRALINE HCL 25 MG PO TABS: 25.0000 mg | ORAL_TABLET | Freq: Every day | ORAL | 2 refills | Status: DC

## 2019-10-31 NOTE — Progress Notes (Signed)
There were no vitals taken for this visit.   Subjective:    Patient ID: Carly Singleton, female    DOB: [birth year], 23 y.o.   MRN: [id number]  HPI: Carly Singleton is a 23 y.o. female  Chief Complaint  Patient presents with  . Depression   DEPRESSION- has been feeling better as she got used to the nexplanon Mood status: better Satisfied with current treatment?: no Symptom severity: moderate  Duration of current treatment : not on anything Psychotherapy/counseling: no  Previous psychiatric medications: sertraline Depressed mood: yes Anxious mood: yes Anhedonia: no Significant weight loss or gain: no Insomnia: no  Fatigue: yes Feelings of worthlessness or guilt: no Impaired concentration/indecisiveness: yes Suicidal ideations: no Hopelessness: no Crying spells: no Depression screen [facility] 2/9 10/31/2019 10/16/2019 06/25/2017 12/24/2016 11/23/2016  Decreased Interest 2 2 0 0 1  Down, Depressed, Hopeless 0 3 0 0 1  PHQ - 2 Score 2 5 0 0 2  Altered sleeping 0 2 0 0 0  Tired, decreased energy 3 3 0 0 3  Change in appetite 0 3 2 0 1  Feeling bad or failure about yourself  0 3 0 0 1  Trouble concentrating 1 1 0 0 0  Moving slowly or fidgety/restless 1 2 0 0 1  Suicidal thoughts 0 0 0 0 0  PHQ-9 Score 7 19 2  0 8  Difficult doing work/chores Not difficult at all Somewhat difficult - - -    Relevant past medical, surgical, family and social history reviewed and updated as indicated. Interim medical history since our last visit reviewed. Allergies and medications reviewed and updated.  Review of Systems  Constitutional: Negative.   Respiratory: Negative.   Cardiovascular: Negative.   Psychiatric/Behavioral: Positive for dysphoric mood. Negative for agitation, behavioral problems, confusion, decreased concentration, hallucinations, self-injury, sleep disturbance and suicidal ideas. The patient is nervous/anxious. The patient is not hyperactive.     Per HPI unless  specifically indicated above     Objective:    There were no vitals taken for this visit.  Wt Readings from Last 3 Encounters:  10/16/19 107 lb (48.5 kg)  10/03/19 109 lb 11.2 oz (49.8 kg)  03/29/19 107 lb 14.4 oz (48.9 kg)    Physical Exam Vitals signs and nursing note reviewed.  Constitutional:      General: She is not in acute distress.    Appearance: Normal appearance. She is not ill-appearing, toxic-appearing or diaphoretic.  HENT:     Head: Normocephalic and atraumatic.     Right Ear: External ear normal.     Left Ear: External ear normal.     Nose: Nose normal.     Mouth/Throat:     Mouth: Mucous membranes are moist.     Pharynx: Oropharynx is clear.  Eyes:     General: No scleral icterus.       Right eye: No discharge.        Left eye: No discharge.     Conjunctiva/sclera: Conjunctivae normal.     Pupils: Pupils are equal, round, and reactive to light.  Neck:     Musculoskeletal: Normal range of motion.  Pulmonary:     Effort: Pulmonary effort is normal. No respiratory distress.     Comments: Speaking in full sentences Musculoskeletal: Normal range of motion.  Skin:    Coloration: Skin is not jaundiced or pale.     Findings: No bruising, erythema, lesion or rash.  Neurological:     Mental Status: She  is alert and oriented to person, place, and time. Mental status is at baseline.  Psychiatric:        Mood and Affect: Mood normal.        Behavior: Behavior normal.        Thought Content: Thought content normal.        Judgment: Judgment normal.     Results for orders placed or performed in visit on 10/16/19  GC/Chlamydia Probe Amp   Specimen: Urine   URI  Result Value Ref Range   Chlamydia trachomatis, NAA Negative Negative   Neisseria Gonorrhoeae by PCR Negative Negative  WET PREP FOR TRICH, YEAST, CLUE   Specimen: Vaginal Fluid   VAGINAL FLUI  Result Value Ref Range   Trichomonas Exam Negative Negative   Yeast Exam Negative Negative   Clue Cell  Exam Positive (A) Negative  Microscopic Examination   VAGINAL FLUI  Result Value Ref Range   WBC, UA 11-30 (A) 0 - 5 /hpf   RBC 3-10 (A) 0 - 2 /hpf   Epithelial Cells (non renal) 0-10 0 - 10 /hpf   Bacteria, UA Few (A) None seen/Few  Urine Culture, Reflex   VAGINAL FLUI  Result Value Ref Range   Urine Culture, Routine Final report    Organism ID, Bacteria No growth   CBC with Differential/Platelet  Result Value Ref Range   WBC 6.6 3.4 - 10.8 x10E3/uL   RBC 4.35 3.77 - 5.28 x10E6/uL   Hemoglobin 12.4 11.1 - 15.9 g/dL   Hematocrit 39.0 30.0 - 46.6 %   MCV 90 79 - 97 fL   MCH 28.5 26.6 - 33.0 pg   MCHC 31.8 31.5 - 35.7 g/dL   RDW 92.3 30.0 - 76.2 %   Platelets 372 150 - 450 x10E3/uL   Neutrophils 59 Not Estab. %   Lymphs 35 Not Estab. %   Monocytes 4 Not Estab. %   Eos 1 Not Estab. %   Basos 1 Not Estab. %   Neutrophils Absolute 3.9 1.4 - 7.0 x10E3/uL   Lymphocytes Absolute 2.3 0.7 - 3.1 x10E3/uL   Monocytes Absolute 0.3 0.1 - 0.9 x10E3/uL   EOS (ABSOLUTE) 0.1 0.0 - 0.4 x10E3/uL   Basophils Absolute 0.1 0.0 - 0.2 x10E3/uL   Immature Granulocytes 0 Not Estab. %   Immature Grans (Abs) 0.0 0.0 - 0.1 x10E3/uL  Comprehensive metabolic panel  Result Value Ref Range   Glucose 86 65 - 99 mg/dL   BUN 12 6 - 20 mg/dL   Creatinine, Ser 2.63 0.57 - 1.00 mg/dL   GFR calc non Af Amer 131 >59 mL/min/1.73   GFR calc Af Amer 151 >59 mL/min/1.73   BUN/Creatinine Ratio 20 9 - 23   Sodium 139 134 - 144 mmol/L   Potassium 4.1 3.5 - 5.2 mmol/L   Chloride 106 96 - 106 mmol/L   CO2 19 (L) 20 - 29 mmol/L   Calcium 9.5 8.7 - 10.2 mg/dL   Total Protein 8.0 6.0 - 8.5 g/dL   Albumin 4.9 3.9 - 5.0 g/dL   Globulin, Total 3.1 1.5 - 4.5 g/dL   Albumin/Globulin Ratio 1.6 1.2 - 2.2   Bilirubin Total 0.5 0.0 - 1.2 mg/dL   Alkaline Phosphatase 53 39 - 117 IU/L   AST 51 (H) 0 - 40 IU/L   ALT 39 (H) 0 - 32 IU/L  Lipid Panel w/o Chol/HDL Ratio  Result Value Ref Range   Cholesterol, Total 175 100 -  199 mg/dL  Triglycerides 62 0 - 149 mg/dL   HDL 55 >16>39 mg/dL   VLDL Cholesterol Cal 12 5 - 40 mg/dL   LDL Chol Calc (NIH) 109108 (H) 0 - 99 mg/dL  TSH  Result Value Ref Range   TSH 0.868 0.450 - 4.500 uIU/mL  UA/M w/rflx Culture, Routine   Specimen: Vaginal Fluid   VAGINAL FLUI  Result Value Ref Range   Specific Gravity, UA 1.025 1.005 - 1.030   pH, UA 5.5 5.0 - 7.5   Color, UA Red (A) Yellow   Appearance Ur Hazy (A) Clear   Leukocytes,UA 1+ (A) Negative   Protein,UA 2+ (A) Negative/Trace   Glucose, UA Negative Negative   Ketones, UA Negative Negative   RBC, UA 3+ (A) Negative   Bilirubin, UA Negative Negative   Urobilinogen, Ur 0.2 0.2 - 1.0 mg/dL   Nitrite, UA Negative Negative   Microscopic Examination See below:    Urinalysis Reflex Comment   HIV Antibody (routine testing w rflx)  Result Value Ref Range   HIV Screen 4th Generation wRfx Non Reactive Non Reactive  Hepatitis, Acute  Result Value Ref Range   Hep A IgM Negative Negative   Hepatitis B Surface Ag Negative Negative   Hep B C IgM Negative Negative   Hep C Virus Ab <0.1 0.0 - 0.9 s/co ratio  RPR  Result Value Ref Range   RPR Ser Ql Non Reactive Non Reactive  HSV(herpes simplex vrs) 1+2 ab-IgG  Result Value Ref Range   HSV 1 Glycoprotein G Ab, IgG 32.70 (H) 0.00 - 0.90 index   HSV 2 IgG, Type Spec 1.39 (H) 0.00 - 0.90 index  HSV-2 IgG Supplemental Test  Result Value Ref Range   HSV-2 IgG Supplemental Test Positive (A) Negative      Assessment & Plan:   Problem List Items Addressed This Visit      Other   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 Call with any concerns. Continue to monitor.       Relevant Medications   sertraline (ZOLOFT) 25 MG tablet       Follow up plan: Return in about 4 weeks (around 11/28/2019) for follow up mood.   . This visit was completed via FaceTime due to the restrictions of the  COVID-19 pandemic. All issues as above were discussed and addressed. Physical exam was done as above through visual confirmation on FaceTime. If it was felt that the patient should be evaluated in the office, they were directed there. The patient verbally consented to this visit. . Location of the patient: home . Location of the provider: home . Those involved with this call:  . Provider: Olevia PerchesMegan Aaron Bostwick, DO . CMA: Tiffany Reel, CMA . Front Desk/Registration: Adela Portshristan Williamson  . Time spent on call: 15 minutes with patient face to face via video conference. More than 50% of this time was spent in counseling and coordination of care. 23 minutes total spent in review of patient's record and preparation of their chart.

## 2019-10-31 NOTE — Assessment & Plan Note (Signed)
Not under good control- will start low dose sertraline and recheck 1 month. Call with any concerns. Continue to monitor.

## 2019-11-02 ENCOUNTER — Encounter: Payer: Self-pay | Admitting: Family Medicine

## 2019-12-15 ENCOUNTER — Ambulatory Visit: Payer: Medicaid Other | Admitting: Family Medicine

## 2020-01-25 ENCOUNTER — Ambulatory Visit: Payer: Self-pay

## 2020-01-25 NOTE — Telephone Encounter (Signed)
Pt. Reports she started having pain and tingling in her left arm 1 week ago. States her Nexplanon is in this arm. States her veins "look bigger." Using arm without difficulty. Also saw blood in her stool this morning and when she wiped. No availability in the practice today per [place]. Pt. Will go to UC.  Reason for Disposition . Numbness (i.e., loss of sensation) in hand or fingers  Answer Assessment - Initial Assessment Questions 1. ONSET: "When did the pain start?"     Started 1 week ago 2. LOCATION: "Where is the pain located?"     Left arm - inner arm 3. PAIN: "How bad is the pain?" (Scale 1-10; or mild, moderate, severe)   - MILD (1-3): doesn't interfere with normal activities   - MODERATE (4-7): interferes with normal activities (e.g., work or school) or awakens from sleep   - SEVERE (8-10): excruciating pain, unable to do any normal activities, unable to hold a cup of water     5 4. WORK OR EXERCISE: "Has there been any recent work or exercise that involved this part of the body?"     No 5. CAUSE: "What do you think is causing the arm pain?"     Maybe the Nexplinon 6. OTHER SYMPTOMS: "Do you have any other symptoms?" (e.g., neck pain, swelling, rash, fever, numbness, weakness)     Tingling 7. PREGNANCY: "Is there any chance you are pregnant?" "When was your last menstrual period?"     No  Protocols used: ARM PAIN-A-AH

## 2020-02-04 ENCOUNTER — Emergency Department
Admission: EM | Admit: 2020-02-04 | Discharge: 2020-02-04 | Discharge status: Against Medical Advice | Payer: Medicaid Other

## 2020-02-04 ENCOUNTER — Other Ambulatory Visit: Payer: Self-pay

## 2020-02-04 NOTE — ED Notes (Signed)
Per registration they attempted to call patient to see if they were coming back in to be seen and no one answered.

## 2020-02-04 NOTE — ED Notes (Signed)
Pt went out to her car, not back for triage.

## 2020-02-04 NOTE — ED Triage Notes (Signed)
Pt notified registration going to car to get her cell phone, will triage upon return.

## 2020-03-21 ENCOUNTER — Encounter: Payer: Self-pay | Admitting: Family Medicine

## 2020-03-21 ENCOUNTER — Ambulatory Visit: Payer: Medicaid Other | Admitting: Family Medicine

## 2020-03-21 ENCOUNTER — Other Ambulatory Visit: Payer: Self-pay

## 2020-03-21 VITALS — BP 102/64 | HR 54 | Temp 98.3°F | Ht 61.71 in | Wt 108.5 lb

## 2020-03-21 DIAGNOSIS — F331 Major depressive disorder, recurrent, moderate: Secondary | ICD-10-CM | POA: Diagnosis not present

## 2020-03-21 NOTE — Progress Notes (Signed)
BP 102/64 (BP Location: Left Arm, Patient Position: Sitting, Cuff Size: Normal)   Pulse (!) 54   Temp 98.3 F (36.8 C) (Oral)   Ht 5' 1.71" (1.567 m)   Wt 108 lb 8 oz (49.2 kg)   SpO2 99%   BMI 20.03 kg/m    Subjective:    Patient ID: Carly Singleton, female    DOB: 04-24-1996, 24 y.o.   MRN: [id number]  HPI: Carly Singleton is a 24 y.o. female  Chief Complaint  Patient presents with  . Depression   DEPRESSION Mood status: better Satisfied with current treatment?: yes Symptom severity: mild  Duration of current treatment : chronic Side effects: no Medication compliance: poor compliance- stopped meds, but feeling well.  Psychotherapy/counseling: no  Previous psychiatric medications: zoloft Depressed mood: no Anxious mood: no Anhedonia: no Significant weight loss or gain: no Insomnia: no  Fatigue: no Feelings of worthlessness or guilt: no Impaired concentration/indecisiveness: no Suicidal ideations: no Hopelessness: no Crying spells: no Depression screen [facility] 2/9 03/21/2020 10/31/2019 10/16/2019 06/25/2017 12/24/2016  Decreased Interest 0 2 2 0 0  Down, Depressed, Hopeless 0 0 3 0 0  PHQ - 2 Score 0 2 5 0 0  Altered sleeping 0 0 2 0 0  Tired, decreased energy 0 3 3 0 0  Change in appetite 0 0 3 2 0  Feeling bad or failure about yourself  0 0 3 0 0  Trouble concentrating 0 1 1 0 0  Moving slowly or fidgety/restless 0 1 2 0 0  Suicidal thoughts 0 0 0 0 0  PHQ-9 Score 0 7 19 2  0  Difficult doing work/chores Not difficult at all Not difficult at all Somewhat difficult - -   Needs form filled out for [facility]- see scanned documents  Relevant past medical, surgical, family and social history reviewed and updated as indicated. Interim medical history since our last visit reviewed. Allergies and medications reviewed and updated.  Review of Systems  Constitutional: Negative.   Respiratory: Negative.   Cardiovascular: Negative.   Musculoskeletal: Negative.     Neurological: Negative.   Psychiatric/Behavioral: Negative.     Per HPI unless specifically indicated above     Objective:    BP 102/64 (BP Location: Left Arm, Patient Position: Sitting, Cuff Size: Normal)   Pulse (!) 54   Temp 98.3 F (36.8 C) (Oral)   Ht 5' 1.71" (1.567 m)   Wt 108 lb 8 oz (49.2 kg)   SpO2 99%   BMI 20.03 kg/m   Wt Readings from Last 3 Encounters:  03/21/20 108 lb 8 oz (49.2 kg)  10/16/19 107 lb (48.5 kg)  10/03/19 109 lb 11.2 oz (49.8 kg)    Physical Exam Vitals and nursing note reviewed.  Constitutional:      General: She is not in acute distress.    Appearance: Normal appearance. She is not ill-appearing, toxic-appearing or diaphoretic.  HENT:     Head: Normocephalic and atraumatic.     Right Ear: External ear normal.     Left Ear: External ear normal.     Nose: Nose normal.     Mouth/Throat:     Mouth: Mucous membranes are moist.     Pharynx: Oropharynx is clear.  Eyes:     General: No scleral icterus.       Right eye: No discharge.        Left eye: No discharge.     Extraocular Movements: Extraocular movements intact.  Conjunctiva/sclera: Conjunctivae normal.     Pupils: Pupils are equal, round, and reactive to light.  Cardiovascular:     Rate and Rhythm: Normal rate and regular rhythm.     Pulses: Normal pulses.     Heart sounds: Normal heart sounds. No murmur. No friction rub. No gallop.   Pulmonary:     Effort: Pulmonary effort is normal. No respiratory distress.     Breath sounds: Normal breath sounds. No stridor. No wheezing, rhonchi or rales.  Chest:     Chest wall: No tenderness.  Musculoskeletal:        General: Normal range of motion.     Cervical back: Normal range of motion and neck supple.  Skin:    General: Skin is warm and dry.     Capillary Refill: Capillary refill takes less than 2 seconds.     Coloration: Skin is not jaundiced or pale.     Findings: No bruising, erythema, lesion or rash.  Neurological:      General: No focal deficit present.     Mental Status: She is alert and oriented to person, place, and time. Mental status is at baseline.  Psychiatric:        Mood and Affect: Mood normal.        Behavior: Behavior normal.        Thought Content: Thought content normal.        Judgment: Judgment normal.     Results for orders placed or performed in visit on 10/16/19  GC/Chlamydia Probe Amp   Specimen: Urine   URI  Result Value Ref Range   Chlamydia trachomatis, NAA Negative Negative   Neisseria Gonorrhoeae by PCR Negative Negative  WET PREP FOR TRICH, YEAST, CLUE   Specimen: Vaginal Fluid   VAGINAL FLUI  Result Value Ref Range   Trichomonas Exam Negative Negative   Yeast Exam Negative Negative   Clue Cell Exam Positive (A) Negative  Microscopic Examination   VAGINAL FLUI  Result Value Ref Range   WBC, UA 11-30 (A) 0 - 5 /hpf   RBC 3-10 (A) 0 - 2 /hpf   Epithelial Cells (non renal) 0-10 0 - 10 /hpf   Bacteria, UA Few (A) None seen/Few  Urine Culture, Reflex   VAGINAL FLUI  Result Value Ref Range   Urine Culture, Routine Final report    Organism ID, Bacteria No growth   CBC with Differential/Platelet  Result Value Ref Range   WBC 6.6 3.4 - 10.8 x10E3/uL   RBC 4.35 3.77 - 5.28 x10E6/uL   Hemoglobin 12.4 11.1 - 15.9 g/dL   Hematocrit 39.0 34.0 - 46.6 %   MCV 90 79 - 97 fL   MCH 28.5 26.6 - 33.0 pg   MCHC 31.8 31.5 - 35.7 g/dL   RDW 13.3 11.7 - 15.4 %   Platelets 372 150 - 450 x10E3/uL   Neutrophils 59 Not Estab. %   Lymphs 35 Not Estab. %   Monocytes 4 Not Estab. %   Eos 1 Not Estab. %   Basos 1 Not Estab. %   Neutrophils Absolute 3.9 1.4 - 7.0 x10E3/uL   Lymphocytes Absolute 2.3 0.7 - 3.1 x10E3/uL   Monocytes Absolute 0.3 0.1 - 0.9 x10E3/uL   EOS (ABSOLUTE) 0.1 0.0 - 0.4 x10E3/uL   Basophils Absolute 0.1 0.0 - 0.2 x10E3/uL   Immature Granulocytes 0 Not Estab. %   Immature Grans (Abs) 0.0 0.0 - 0.1 x10E3/uL  Comprehensive metabolic panel  Result Value Ref Range  Glucose 86 65 - 99 mg/dL   BUN 12 6 - 20 mg/dL   Creatinine, Ser 1.19 0.57 - 1.00 mg/dL   GFR calc non Af Amer 131 >59 mL/min/1.73   GFR calc Af Amer 151 >59 mL/min/1.73   BUN/Creatinine Ratio 20 9 - 23   Sodium 139 134 - 144 mmol/L   Potassium 4.1 3.5 - 5.2 mmol/L   Chloride 106 96 - 106 mmol/L   CO2 19 (L) 20 - 29 mmol/L   Calcium 9.5 8.7 - 10.2 mg/dL   Total Protein 8.0 6.0 - 8.5 g/dL   Albumin 4.9 3.9 - 5.0 g/dL   Globulin, Total 3.1 1.5 - 4.5 g/dL   Albumin/Globulin Ratio 1.6 1.2 - 2.2   Bilirubin Total 0.5 0.0 - 1.2 mg/dL   Alkaline Phosphatase 53 39 - 117 IU/L   AST 51 (H) 0 - 40 IU/L   ALT 39 (H) 0 - 32 IU/L  Lipid Panel w/o Chol/HDL Ratio  Result Value Ref Range   Cholesterol, Total 175 100 - 199 mg/dL   Triglycerides 62 0 - 149 mg/dL   HDL 55 >14 mg/dL   VLDL Cholesterol Cal 12 5 - 40 mg/dL   LDL Chol Calc (NIH) 782 (H) 0 - 99 mg/dL  TSH  Result Value Ref Range   TSH 0.868 0.450 - 4.500 uIU/mL  UA/M w/rflx Culture, Routine   Specimen: Vaginal Fluid   VAGINAL FLUI  Result Value Ref Range   Specific Gravity, UA 1.025 1.005 - 1.030   pH, UA 5.5 5.0 - 7.5   Color, UA Red (A) Yellow   Appearance Ur Hazy (A) Clear   Leukocytes,UA 1+ (A) Negative   Protein,UA 2+ (A) Negative/Trace   Glucose, UA Negative Negative   Ketones, UA Negative Negative   RBC, UA 3+ (A) Negative   Bilirubin, UA Negative Negative   Urobilinogen, Ur 0.2 0.2 - 1.0 mg/dL   Nitrite, UA Negative Negative   Microscopic Examination See below:    Urinalysis Reflex Comment   HIV Antibody (routine testing w rflx)  Result Value Ref Range   HIV Screen 4th Generation wRfx Non Reactive Non Reactive  Hepatitis, Acute  Result Value Ref Range   Hep A IgM Negative Negative   Hepatitis B Surface Ag Negative Negative   Hep B C IgM Negative Negative   Hep C Virus Ab <0.1 0.0 - 0.9 s/co ratio  RPR  Result Value Ref Range   RPR Ser Ql Non Reactive Non Reactive  HSV(herpes simplex vrs) 1+2 ab-IgG    Result Value Ref Range   HSV 1 Glycoprotein G Ab, IgG 32.70 (H) 0.00 - 0.90 index   HSV 2 IgG, Type Spec 1.39 (H) 0.00 - 0.90 index  HSV-2 IgG Supplemental Test  Result Value Ref Range   HSV-2 IgG Supplemental Test Positive (A) Negative      Assessment & Plan:   Problem List Items Addressed This Visit      Other   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 Continue to monitor. Call with any concerns. Recheck 6 months.           Follow up plan: Return in about 6 months (around 09/21/2020).

## 2020-03-21 NOTE — Assessment & Plan Note (Signed)
Under good control off medicine. Continue to monitor. Call with any concerns. Recheck 6 months.

## 2020-08-07 ENCOUNTER — Ambulatory Visit: Payer: Medicaid Other | Admitting: Family Medicine

## 2020-08-09 ENCOUNTER — Ambulatory Visit: Payer: Medicaid Other | Admitting: Obstetrics and Gynecology

## 2020-08-30 ENCOUNTER — Ambulatory Visit: Payer: Medicaid Other | Admitting: Family Medicine

## 2020-08-30 ENCOUNTER — Other Ambulatory Visit: Payer: Self-pay

## 2020-08-30 ENCOUNTER — Encounter: Payer: Self-pay | Admitting: Family Medicine

## 2020-08-30 VITALS — BP 104/69 | HR 61 | Temp 98.6°F | Ht 61.0 in | Wt 111.0 lb

## 2020-08-30 DIAGNOSIS — K219 Gastro-esophageal reflux disease without esophagitis: Secondary | ICD-10-CM

## 2020-08-30 DIAGNOSIS — L918 Other hypertrophic disorders of the skin: Secondary | ICD-10-CM

## 2020-08-30 DIAGNOSIS — M25511 Pain in right shoulder: Secondary | ICD-10-CM | POA: Diagnosis not present

## 2020-08-30 DIAGNOSIS — B9689 Other specified bacterial agents as the cause of diseases classified elsewhere: Secondary | ICD-10-CM

## 2020-08-30 DIAGNOSIS — N760 Acute vaginitis: Secondary | ICD-10-CM

## 2020-08-30 DIAGNOSIS — M25512 Pain in left shoulder: Secondary | ICD-10-CM

## 2020-08-30 DIAGNOSIS — N898 Other specified noninflammatory disorders of vagina: Secondary | ICD-10-CM | POA: Diagnosis not present

## 2020-08-30 MED ORDER — METRONIDAZOLE 500 MG PO TABS: 500.0000 mg | ORAL_TABLET | Freq: Two times a day (BID) | ORAL | 0 refills | Status: DC

## 2020-08-30 MED ORDER — OMEPRAZOLE 20 MG PO CPDR: 20.0000 mg | DELAYED_RELEASE_CAPSULE | Freq: Every day | ORAL | 3 refills | Status: DC

## 2020-08-30 NOTE — Progress Notes (Signed)
BP 104/69   Pulse 61   Temp 98.6 F (37 C) (Oral)   Ht 5\' 1"  (1.549 m)   Wt 111 lb (50.3 kg)   LMP 05/28/2020   SpO2 98%   BMI 20.97 kg/m    Subjective:    Patient ID: 07/28/2020, female    DOB: 1996-05-20, 24 y.o.   MRN: [id number]  HPI: Carly Singleton is a 24 y.o. female  Chief Complaint  Patient presents with  . Shoulder Pain    X2 months, cold alcohol makes shoulder joints hurt has no other problems any other time, last for just one min and goes away   SHOULDER PAIN Duration: 2 months Involved shoulder: bilateral Mechanism of injury: unknown Location: diffuse Onset:sudden Severity: moderate  Quality:  Aching, burning Frequency: lasts about 30 seconds, only when she's drinking alcohol (tequila or champagne) Radiation: no Aggravating factors: drinking alcohol   Alleviating factors: nothing  Status: worse Treatments attempted: none  Relief with NSAIDs?:  No NSAIDs Taken Weakness: no Numbness: no Decreased grip strength: no Redness: no Swelling: no Bruising: no Fevers: no  Sore throat for about 3 months.   VAGINAL DISCHARGE Duration: about a week or 2 Discharge description: cottage cheese  Pruritus: yes Dysuria: yes Malodorous: yes Urinary frequency: no Fevers: no Abdominal pain: yes  Sexual activity: monogamous History of sexually transmitted diseases: no Recent antibiotic use: no Context: recurrent BV  Treatments attempted: none  Has an irritated skin tag on her inner thigh that has been bothering her.   Relevant past medical, surgical, family and social history reviewed and updated as indicated. Interim medical history since our last visit reviewed. Allergies and medications reviewed and updated.  Review of Systems  Constitutional: Negative.   Respiratory: Negative.   Cardiovascular: Negative.   Musculoskeletal: Positive for arthralgias. Negative for back pain, gait problem, joint swelling, myalgias, neck pain and neck  stiffness.  Skin: Negative.   Neurological: Negative.   Psychiatric/Behavioral: Negative.     Per HPI unless specifically indicated above     Objective:    BP 104/69   Pulse 61   Temp 98.6 F (37 C) (Oral)   Ht 5\' 1"  (1.549 m)   Wt 111 lb (50.3 kg)   LMP 05/28/2020   SpO2 98%   BMI 20.97 kg/m   Wt Readings from Last 3 Encounters:  08/30/20 111 lb (50.3 kg)  03/21/20 108 lb 8 oz (49.2 kg)  10/16/19 107 lb (48.5 kg)    Physical Exam Vitals and nursing note reviewed.  Constitutional:      General: She is not in acute distress.    Appearance: Normal appearance. She is not ill-appearing, toxic-appearing or diaphoretic.  HENT:     Head: Normocephalic and atraumatic.     Right Ear: External ear normal.     Left Ear: External ear normal.     Nose: Nose normal.     Mouth/Throat:     Mouth: Mucous membranes are moist.     Pharynx: Oropharynx is clear.  Eyes:     General: No scleral icterus.       Right eye: No discharge.        Left eye: No discharge.     Extraocular Movements: Extraocular movements intact.     Conjunctiva/sclera: Conjunctivae normal.     Pupils: Pupils are equal, round, and reactive to light.  Cardiovascular:     Rate and Rhythm: Normal rate and regular rhythm.     Pulses: Normal  pulses.     Heart sounds: Normal heart sounds. No murmur heard.  No friction rub. No gallop.   Pulmonary:     Effort: Pulmonary effort is normal. No respiratory distress.     Breath sounds: Normal breath sounds. No stridor. No wheezing, rhonchi or rales.  Chest:     Chest wall: No tenderness.  Musculoskeletal:        General: Normal range of motion.     Cervical back: Normal range of motion and neck supple.  Skin:    General: Skin is warm and dry.     Capillary Refill: Capillary refill takes less than 2 seconds.     Coloration: Skin is not jaundiced or pale.     Findings: No bruising, erythema, lesion or rash.  Neurological:     General: No focal deficit present.      Mental Status: She is alert and oriented to person, place, and time. Mental status is at baseline.  Psychiatric:        Mood and Affect: Mood normal.        Behavior: Behavior normal.        Thought Content: Thought content normal.        Judgment: Judgment normal.     Results for orders placed or performed in visit on 08/30/20  WET PREP FOR TRICH, YEAST, CLUE   Specimen: Vaginal; Sterile Swab   Sterile Swab  Result Value Ref Range   Trichomonas Exam Negative Negative   Yeast Exam Negative Negative   Clue Cell Exam Positive (A) Negative      Assessment & Plan:   Problem List Items Addressed This Visit    None    Visit Diagnoses    Acute pain of both shoulders    -  Primary   FROM, no tenderness on exam. Patient would like to see ortho. Referral generated today.   Relevant Orders   Ambulatory referral to Orthopedic Surgery   Skin tag       Will get her back in for removal ASAP.    BV (bacterial vaginosis)       Will treat with metronidazole. Call with any concerns or if not improving.    Relevant Medications   metroNIDAZOLE (FLAGYL) 500 MG tablet   Vaginal discharge       + clue cells.    Relevant Orders   WET PREP FOR TRICH, YEAST, CLUE (Completed)   Laryngopharyngeal reflux (LPR)       Will treat with omeprazole. Recheck 1 month. Call with any concerns.        Follow up plan: Return in about 4 weeks (around 09/27/2020).

## 2020-08-31 LAB — WET PREP FOR TRICH, YEAST, CLUE
Clue Cell Exam: POSITIVE — AB
Trichomonas Exam: NEGATIVE
Yeast Exam: NEGATIVE

## 2020-09-03 ENCOUNTER — Encounter: Payer: Self-pay | Admitting: Family Medicine

## 2020-09-24 ENCOUNTER — Ambulatory Visit: Payer: Medicaid Other | Admitting: Family Medicine

## 2020-10-01 DIAGNOSIS — Z20822 Contact with and (suspected) exposure to covid-19: Secondary | ICD-10-CM | POA: Diagnosis not present

## 2020-10-01 DIAGNOSIS — Z03818 Encounter for observation for suspected exposure to other biological agents ruled out: Secondary | ICD-10-CM | POA: Diagnosis not present

## 2020-10-15 DIAGNOSIS — Z03818 Encounter for observation for suspected exposure to other biological agents ruled out: Secondary | ICD-10-CM | POA: Diagnosis not present

## 2020-10-15 DIAGNOSIS — Z20822 Contact with and (suspected) exposure to covid-19: Secondary | ICD-10-CM | POA: Diagnosis not present

## 2020-10-21 ENCOUNTER — Other Ambulatory Visit: Payer: Self-pay

## 2020-10-21 ENCOUNTER — Ambulatory Visit (INDEPENDENT_AMBULATORY_CARE_PROVIDER_SITE_OTHER): Payer: Medicaid Other | Admitting: Family Medicine

## 2020-10-21 ENCOUNTER — Encounter: Payer: Self-pay | Admitting: Family Medicine

## 2020-10-21 VITALS — BP 94/60 | HR 69 | Temp 98.8°F | Ht 61.0 in | Wt 109.0 lb

## 2020-10-21 DIAGNOSIS — L918 Other hypertrophic disorders of the skin: Secondary | ICD-10-CM

## 2020-10-21 NOTE — Progress Notes (Signed)
BP 94/60 (BP Location: Left Arm, Patient Position: Sitting)   Pulse 69   Temp 98.8 F (37.1 C) (Oral)   Ht 5\' 1"  (1.549 m)   Wt 109 lb (49.4 kg)   SpO2 99%   BMI 20.60 kg/m    Subjective:    Patient ID: , female    DOB: [birth year], 24 y.o.   MRN: [id number]  HPI: Laurette Villescas is a 24 y.o. female  Chief Complaint  Patient presents with  . skin tag removal    2 left inner thigh and one right buttocks   SKIN LESION Duration: months Location: L inner thigh  Painful: no Itching: yes Onset: gradual Context: bigger History of skin cancer: no History of precancerous skin lesions: no Family history of skin cancer: no  Relevant past medical, surgical, family and social history reviewed and updated as indicated. Interim medical history since our last visit reviewed. Allergies and medications reviewed and updated.  Review of Systems  Constitutional: Negative.   Respiratory: Negative.   Cardiovascular: Negative.   Gastrointestinal: Negative.   Musculoskeletal: Negative.   Psychiatric/Behavioral: Negative.     Per HPI unless specifically indicated above     Objective:    BP 94/60 (BP Location: Left Arm, Patient Position: Sitting)   Pulse 69   Temp 98.8 F (37.1 C) (Oral)   Ht 5\' 1"  (1.549 m)   Wt 109 lb (49.4 kg)   SpO2 99%   BMI 20.60 kg/m   Wt Readings from Last 3 Encounters:  10/21/20 109 lb (49.4 kg)  08/30/20 111 lb (50.3 kg)  03/21/20 108 lb 8 oz (49.2 kg)    Physical Exam Vitals and nursing note reviewed.  Constitutional:      General: She is not in acute distress.    Appearance: Normal appearance. She is not ill-appearing, toxic-appearing or diaphoretic.  HENT:     Head: Normocephalic and atraumatic.     Right Ear: External ear normal.     Left Ear: External ear normal.     Nose: Nose normal.     Mouth/Throat:     Mouth: Mucous membranes are moist.     Pharynx: Oropharynx is clear.  Eyes:     General: No scleral  icterus.       Right eye: No discharge.        Left eye: No discharge.     Extraocular Movements: Extraocular movements intact.     Conjunctiva/sclera: Conjunctivae normal.     Pupils: Pupils are equal, round, and reactive to light.  Cardiovascular:     Rate and Rhythm: Normal rate and regular rhythm.     Pulses: Normal pulses.     Heart sounds: Normal heart sounds. No murmur heard.  No friction rub. No gallop.   Pulmonary:     Effort: Pulmonary effort is normal. No respiratory distress.     Breath sounds: Normal breath sounds. No stridor. No wheezing, rhonchi or rales.  Chest:     Chest wall: No tenderness.  Musculoskeletal:        General: Normal range of motion.     Cervical back: Normal range of motion and neck supple.  Skin:    General: Skin is warm and dry.     Capillary Refill: Capillary refill takes less than 2 seconds.     Coloration: Skin is not jaundiced or pale.     Findings: No bruising, erythema, lesion or rash.     Comments: 2 mm skin tag  on L inner thigh  Neurological:     General: No focal deficit present.     Mental Status: She is alert and oriented to person, place, and time. Mental status is at baseline.  Psychiatric:        Mood and Affect: Mood normal.        Behavior: Behavior normal.        Thought Content: Thought content normal.        Judgment: Judgment normal.     Results for orders placed or performed in visit on 08/30/20  WET PREP FOR TRICH, YEAST, CLUE   Specimen: Vaginal; Sterile Swab   Sterile Swab  Result Value Ref Range   Trichomonas Exam Negative Negative   Yeast Exam Negative Negative   Clue Cell Exam Positive (A) Negative      Assessment & Plan:   Problem List Items Addressed This Visit    None    Visit Diagnoses    Skin tag    -  Primary   Removed today as below. Call with any concerns.       Skin Procedure  Procedure: Informed consent given.  Sterile prep of the area.  Area infiltrated with lidocaine with epinephrine.   Using a loop bovy, part of the upper dermis shaved off and area cauterized. Pt ed on scarring.     Diagnosis:   ICD-10-CM   1. Skin tag  L91.8    Removed today as below. Call with any concerns.     Lesion Location/Size: 2 mm skin tag on L inner thigh Physician: MJ Consent:  Risks, benefits, and alternative treatments discussed and all questions were answered.  Patient elected to proceed and verbal consent obtained.  Description: Area prepped and draped using semi-sterile technique. Area locally anesthetized using 2 cc's of lidocaine 2% with epi. Lesion removed and adequate hemostastis achieved using electrocautery.  Wound dressed after application of bacitracin ointment.  Follow up plan: Return if symptoms worsen or fail to improve.

## 2020-12-27 ENCOUNTER — Encounter: Payer: Medicaid Other | Admitting: Obstetrics and Gynecology

## 2021-01-02 ENCOUNTER — Encounter: Payer: Self-pay | Admitting: Obstetrics and Gynecology

## 2021-01-06 ENCOUNTER — Emergency Department: Payer: Medicaid Other

## 2021-01-06 ENCOUNTER — Other Ambulatory Visit: Payer: Self-pay

## 2021-01-06 DIAGNOSIS — R42 Dizziness and giddiness: Secondary | ICD-10-CM | POA: Insufficient documentation

## 2021-01-06 DIAGNOSIS — Z20822 Contact with and (suspected) exposure to covid-19: Secondary | ICD-10-CM | POA: Diagnosis not present

## 2021-01-06 DIAGNOSIS — R0789 Other chest pain: Secondary | ICD-10-CM | POA: Diagnosis not present

## 2021-01-06 DIAGNOSIS — R059 Cough, unspecified: Secondary | ICD-10-CM | POA: Diagnosis not present

## 2021-01-06 LAB — COMPREHENSIVE METABOLIC PANEL
ALT: 12 U/L (ref 0–44)
AST: 18 U/L (ref 15–41)
Albumin: 4.4 g/dL (ref 3.5–5.0)
Alkaline Phosphatase: 47 U/L (ref 38–126)
Anion gap: 10 (ref 5–15)
BUN: 15 mg/dL (ref 6–20)
CO2: 24 mmol/L (ref 22–32)
Calcium: 9.5 mg/dL (ref 8.9–10.3)
Chloride: 104 mmol/L (ref 98–111)
Creatinine, Ser: 0.58 mg/dL (ref 0.44–1.00)
GFR, Estimated: >60 mL/min (ref >60)
Glucose, Bld: 91 mg/dL (ref 70–99)
Potassium: 4.2 mmol/L (ref 3.5–5.1)
Sodium: 138 mmol/L (ref 135–145)
Total Bilirubin: 0.9 mg/dL (ref 0.3–1.2)
Total Protein: 8 g/dL (ref 6.5–8.1)

## 2021-01-06 LAB — CBC
HCT: 37.6 % (ref 36.0–46.0)
Hemoglobin: 13.1 g/dL (ref 12.0–15.0)
MCH: 30.3 pg (ref 26.0–34.0)
MCHC: 34.8 g/dL (ref 30.0–36.0)
MCV: 87 fL (ref 80.0–100.0)
Platelets: 427 10*3/uL — ABNORMAL HIGH (ref 150–400)
RBC: 4.32 MIL/uL (ref 3.87–5.11)
RDW: 12.8 % (ref 11.5–15.5)
WBC: 8 10*3/uL (ref 4.0–10.5)
nRBC: 0 % (ref 0.0–0.2)

## 2021-01-06 LAB — TROPONIN I (HIGH SENSITIVITY): Troponin I (High Sensitivity): 2 ng/L (ref <18)

## 2021-01-06 NOTE — ED Triage Notes (Signed)
Pt in with co chest pain since yesterday along with some dizziness, no hx of the same. Denies any cold or flu symptoms.

## 2021-01-07 ENCOUNTER — Encounter: Payer: Self-pay | Admitting: Radiology

## 2021-01-07 ENCOUNTER — Emergency Department
Admission: EM | Admit: 2021-01-07 | Discharge: 2021-01-07 | Disposition: A | Discharge status: Home / Self Care | Payer: Medicaid Other | Attending: Emergency Medicine | Admitting: Emergency Medicine

## 2021-01-07 DIAGNOSIS — R0789 Other chest pain: Secondary | ICD-10-CM

## 2021-01-07 DIAGNOSIS — R059 Cough, unspecified: Secondary | ICD-10-CM | POA: Diagnosis not present

## 2021-01-07 LAB — TROPONIN I (HIGH SENSITIVITY): Troponin I (High Sensitivity): <2 ng/L (ref <18)

## 2021-01-07 LAB — POC SARS CORONAVIRUS 2 AG: SARS Coronavirus 2 Ag: NEGATIVE

## 2021-01-07 LAB — SARS CORONAVIRUS 2 (TAT 6-24 HRS): SARS Coronavirus 2: NEGATIVE

## 2021-01-07 NOTE — Discharge Instructions (Addendum)
Your Covid PCR is pending. If it is positive we will contact you but you can get your results on mychart. Follow up with your doctor in 3 days. Return to the ER for new or worsening chest pain, shortness of breath, abdominal pain, or fever.

## 2021-01-07 NOTE — ED Notes (Signed)
Patient discharged to home per MD order. Patient in stable condition, and deemed medically cleared by ED provider for discharge. Discharge instructions reviewed with patient/family using "Teach Back"; verbalized understanding of medication education and administration, and information about follow-up care. Denies further concerns. ° °

## 2021-01-07 NOTE — ED Provider Notes (Addendum)
[facility] Emergency Department Provider Note  ____________________________________________  Time seen: Approximately 6:08 AM  I have reviewed the triage vital signs and the nursing notes.   HISTORY  Chief Complaint Chest Pain and Dizziness   HPI Carly Singleton is a 25 y.o. female with no significant past medical history who presents for evaluation of chest tightness.  Patient reports that her symptoms started yesterday.  She describes central chest tightness associated with lightheadedness.  No cough, no fever, no pleuritic chest pain, no shortness of breath, no vomiting or diarrhea.  No personal or family history of blood clots, no recent travel immobilization, no leg pain or swelling, no hemoptysis.  Her symptoms have now fully resolved without any intervention.  No history of smoking, no history of asthma or COPD.  No known exposures to COVID.   Past Medical History:  Diagnosis Date  . Chlamydia   . Medical history non-contributory   . 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 NO PAST SURGERIES      Prior to Admission medications   Medication Sig Start Date End Date Taking? Authorizing Provider  etonogestrel (NEXPLANON) 68 MG IMPL implant 1 each by Subdermal route once. 10/03/19   Hildred Laser, MD  omeprazole (PRILOSEC) 20 MG capsule Take 1 capsule (20 mg total) by mouth daily. 08/30/20   Olevia Perches P, DO    Allergies Azithromycin and Penicillin g  Family History  Problem Relation Age of Onset  . Healthy Mother   . Healthy Father   . Down syndrome Daughter   . Cancer Neg Hx   . Diabetes Neg Hx   . Heart disease Neg Hx     Social History Social History   Tobacco Use  . Smoking status: Never Smoker  . Smokeless tobacco: Never  Used  Vaping Use  . Vaping Use: Never used  Substance Use Topics  . Alcohol use: Yes    Comment: monthly  . Drug use: Yes    Types: Marijuana    Review of Systems  Constitutional: Negative for fever. + Lightheadedness Eyes: Negative for visual changes. ENT: Negative for sore throat. Neck: No neck pain  Cardiovascular: Negative for chest pain. + Chest tightness Respiratory: Negative for shortness of breath. Gastrointestinal: Negative for abdominal pain, vomiting or diarrhea. Genitourinary: Negative for dysuria. Musculoskeletal: Negative for back pain. Skin: Negative for rash. Neurological: Negative for headaches, weakness or numbness. Psych: No SI or HI  ____________________________________________   PHYSICAL EXAM:  VITAL SIGNS: ED Triage Vitals  Enc Vitals Group     BP 01/06/21 2145 (!) 111/59     Pulse Rate 01/06/21 2145 (!) 58     Resp 01/06/21 2145 20     Temp 01/06/21 2145 98.7 F (37.1 C)     Temp Source 01/06/21 2145 Oral     SpO2 01/06/21 2145 99 %     Weight 01/06/21 2146 110 lb (49.9 kg)     Height 01/06/21 2146 5\' 1"  (1.549 m)     Head Circumference --      Peak Flow --      Pain Score 01/06/21 2146 8     Pain Loc --      Pain Edu? --      Excl. in GC? --  Constitutional: Alert and oriented. Well appearing and in no apparent distress. HEENT:      Head: Normocephalic and atraumatic.         Eyes: Conjunctivae are normal. Sclera is non-icteric.       Mouth/Throat: Mucous membranes are moist.       Neck: Supple with no signs of meningismus. Cardiovascular: Regular rate and rhythm. No murmurs, gallops, or rubs. 2+ symmetrical distal pulses are present in all extremities. No JVD. Respiratory: Normal respiratory effort. Lungs are clear to auscultation bilaterally. No wheezes, crackles, or rhonchi.  Gastrointestinal: Soft, non tender, and non distended. Musculoskeletal: No edema, cyanosis, or erythema of extremities. Neurologic: Normal speech and  language. Face is symmetric. Moving all extremities. No gross focal neurologic deficits are appreciated. Skin: Skin is warm, dry and intact. No rash noted. Psychiatric: Mood and affect are normal. Speech and behavior are normal.  ____________________________________________   LABS (all labs ordered are listed, but only abnormal results are displayed)  Labs Reviewed  CBC - Abnormal; Notable for the following components:      Result Value   Platelets 427 (*)    All other components within normal limits  SARS CORONAVIRUS 2 (TAT 6-24 HRS)  COMPREHENSIVE METABOLIC PANEL  POC SARS CORONAVIRUS 2 AG -  ED  POC SARS CORONAVIRUS 2 AG  TROPONIN I (HIGH SENSITIVITY)  TROPONIN I (HIGH SENSITIVITY)   ____________________________________________  EKG  ED ECG REPORT I, Nita Sickle, the attending physician, personally viewed and interpreted this ECG.  Normal sinus rhythm, rate of 60, normal intervals, normal axis, no ST elevations or depressions.  Normal EKG. ____________________________________________  RADIOLOGY  I have personally reviewed the images performed during this visit and I agree with the Radiologist's read.   Interpretation by Radiologist:  DG Chest 2 View  Result Date: 01/07/2021 CLINICAL DATA:  Cough. EXAM: CHEST - 2 VIEW COMPARISON:  December 23, 2017 FINDINGS: The heart size and mediastinal contours are within normal limits. Both lungs are clear. The visualized skeletal structures are unremarkable. IMPRESSION: No active cardiopulmonary disease. Electronically Signed   By: Katherine Mantle M.D.   On: 01/07/2021 00:17     ____________________________________________   PROCEDURES  Procedure(s) performed: None Procedures Critical Care performed:  None ____________________________________________   INITIAL IMPRESSION / ASSESSMENT AND PLAN / ED COURSE  25 y.o. female with no significant past medical history who presents for evaluation of chest tightness and  lightheadedness which has now resolved.    Ddx bronchospasms, viral URI, PNA, pericarditis, myocarditis, covid, MSK, GERD, pleurisy.  EKG with no signs of acute ischemic changes or pericarditis.  Patient is extremely well-appearing in no distress, normal work of breathing normal sats both at rest and with ambulation, lungs are clear to auscultation with no wheezing or crackles, good air movement, no leg swelling, heart regular rate and rhythm with no murmur, abdomen is soft with no tenderness.  High-sensitivity troponin x2 negative with no signs of myocarditis.  Chest x-ray with no evidence of edema, pneumonia, pneumothorax, visualized by me confirmed by radiology.  Labs with no acute findings including normal white count, normal electrolytes, normal LFTs.  Rapid COVID-negative.  Will send COVID PCR.  PERC negative with fully resolved symptoms therefore low suspicion for PE.  Recommended close follow-up with PCP and discussed my standard return precautions. Old medical records reviewed.          _____________________________________________ Please note:  Patient was evaluated in Emergency Department today for the symptoms described in the history of present  illness. Patient was evaluated in the context of the global COVID-19 pandemic, which necessitated consideration that the patient might be at risk for infection with the SARS-CoV-2 virus that causes COVID-19. Institutional protocols and algorithms that pertain to the evaluation of patients at risk for COVID-19 are in a state of rapid change based on information released by regulatory bodies including the CDC and federal and state organizations. These policies and algorithms were followed during the patient's care in the ED.  Some ED evaluations and interventions may be delayed as a result of limited staffing during the pandemic.   [place] Controlled Substance Database was reviewed by me. ____________________________________________   FINAL CLINICAL  IMPRESSION(S) / ED DIAGNOSES   Final diagnoses:  Chest tightness      NEW MEDICATIONS STARTED DURING THIS VISIT:  ED Discharge Orders    None       Note:  This document was prepared using Dragon voice recognition software and may include unintentional dictation errors.    Don Perking, [place], MD 01/07/21 [id number]    Nita Sickle, MD 01/07/21 [phone]

## 2021-01-08 ENCOUNTER — Telehealth: Payer: Self-pay

## 2021-01-08 NOTE — Telephone Encounter (Signed)
Transition Care Management Unsuccessful Follow-up Telephone Call  Date of discharge and from where:  01/07/2021 [facility] ED  Attempts:  1st Attempt  Reason for unsuccessful TCM follow-up call:  Left voice message

## 2021-01-09 NOTE — Telephone Encounter (Signed)
Transition Care Management Follow-up Telephone Call  Date of discharge and from where: 01/07/2021 [facility] ED  How have you been since you were released from the hospital? Everything is much better.   Any questions or concerns? No  Items Reviewed:  Did the pt receive and understand the discharge instructions provided? Yes   Medications obtained and verified? No medications ordered.  Other? No   Any new allergies since your discharge? No   Dietary orders reviewed? Yes  Do you have support at home? Yes    Functional Questionnaire: (I = Independent and D = Dependent) ADLs: I  Bathing/Dressing- I  Meal Prep- I  Eating- I  Maintaining continence- I  Transferring/Ambulation- I  Managing Meds- I  Follow up appointments reviewed:   PCP Hospital f/u appt confirmed? No  Patient denied appointment at this time.   Specialist Hospital f/u appt confirmed? No    Are transportation arrangements needed? No   If their condition worsens, is the pt aware to call PCP or go to the Emergency Dept.? Yes  Was the patient provided with contact information for the PCP's office or ED? Yes  Was to pt encouraged to call back with questions or concerns? Yes

## 2021-01-09 NOTE — Telephone Encounter (Signed)
Transition Care Management Unsuccessful Follow-up Telephone Call  Date of discharge and from where:  01/07/2021 [facility] ED   Attempts:  2nd Attempt  Reason for unsuccessful TCM follow-up call:  Unable to leave message, voicemail full

## 2021-02-07 DIAGNOSIS — Z20822 Contact with and (suspected) exposure to covid-19: Secondary | ICD-10-CM | POA: Diagnosis not present

## 2021-02-07 DIAGNOSIS — Z03818 Encounter for observation for suspected exposure to other biological agents ruled out: Secondary | ICD-10-CM | POA: Diagnosis not present

## 2021-06-17 ENCOUNTER — Encounter: Payer: Medicaid Other | Admitting: Obstetrics and Gynecology

## 2021-07-23 ENCOUNTER — Encounter: Payer: Medicaid Other | Admitting: Obstetrics and Gynecology

## 2021-07-23 ENCOUNTER — Encounter: Payer: Self-pay | Admitting: Obstetrics and Gynecology

## 2021-07-28 ENCOUNTER — Encounter: Payer: Medicaid Other | Admitting: Family Medicine

## 2021-11-03 ENCOUNTER — Other Ambulatory Visit: Payer: Self-pay

## 2021-11-03 ENCOUNTER — Ambulatory Visit: Payer: Medicaid Other | Admitting: Nurse Practitioner

## 2021-11-03 ENCOUNTER — Ambulatory Visit: Payer: Self-pay

## 2021-11-03 ENCOUNTER — Encounter: Payer: Self-pay | Admitting: Nurse Practitioner

## 2021-11-03 VITALS — BP 119/84 | HR 66 | Temp 99.9°F | Resp 18 | Wt 105.8 lb

## 2021-11-03 DIAGNOSIS — N939 Abnormal uterine and vaginal bleeding, unspecified: Secondary | ICD-10-CM

## 2021-11-03 LAB — CBC WITH DIFFERENTIAL/PLATELET
Hematocrit: 36.4 % (ref 34.0–46.6)
Hemoglobin: 12.9 g/dL (ref 11.1–15.9)
Lymphocytes Absolute: 2.9 10*3/uL (ref 0.7–3.1)
Lymphs: 41 %
MCH: 30.2 pg (ref 26.6–33.0)
MCHC: 35.4 g/dL (ref 31.5–35.7)
MCV: 85 fL (ref 79–97)
MID (Absolute): 0.5 10*3/uL (ref 0.1–1.6)
MID: 7 %
Neutrophils Absolute: 3.6 10*3/uL (ref 1.4–7.0)
Neutrophils: 52 %
Platelets: 353 10*3/uL (ref 150–450)
RBC: 4.27 x10E6/uL (ref 3.77–5.28)
RDW: 13.2 % (ref 11.7–15.4)
WBC: 7 10*3/uL (ref 3.4–10.8)

## 2021-11-03 LAB — PREGNANCY, URINE: Preg Test, Ur: NEGATIVE

## 2021-11-03 NOTE — Progress Notes (Signed)
Acute Office Visit  Subjective:    Patient ID: Carly Singleton, female    DOB: 02/06/1996, 25 y.o.   MRN: [id number]  Chief Complaint  Patient presents with   Vaginal Bleeding    For 3 months    HPI Patient is in today for vaginal bleeding that has been ongoing for the last 3 months. The amount of bleeding varies per day, but during the last few days her bleeding has significantly worsened. She will need to change her tampon every hour and a pad every 2 hours. She is also passing large clots. She had a nexplanon that was implanted in either January of 2021 or 2022, she doesn't remember which date. She also endorses mild lower abdominal pain. She denies fevers, shortness of breath, and fatigue. She has an appointment with GYN on Friday 11/07/21.    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    Past Surgical History:  Procedure Laterality Date   NO PAST SURGERIES      Family History  Problem Relation Age of Onset   Healthy Mother    Healthy Father    Down syndrome Daughter    Cancer Neg Hx    Diabetes Neg Hx    Heart disease Neg Hx     Social History   Socioeconomic History   Marital status: Single    Spouse name: Not on file   Number of children: Not on file   Years of education: Not on file   Highest education level: Not on file  Occupational History   Not on file  Tobacco Use   Smoking status: Never   Smokeless tobacco: Never  Vaping Use   Vaping Use: Never used  Substance and Sexual Activity   Alcohol use: Yes    Comment: monthly   Drug use: Yes    Types: Marijuana   Sexual activity: Yes    Birth control/protection: Condom  Other Topics Concern   Not on file  Social History Narrative   Not on file   Social Determinants of Health   Financial Resource Strain: Not on file  Food Insecurity: Not on file  Transportation Needs:  Not on file  Physical Activity: Not on file  Stress: Not on file  Social Connections: Not on file  Intimate Partner Violence: Not on file    Outpatient Medications Prior to Visit  Medication Sig Dispense Refill   etonogestrel (NEXPLANON) 68 MG IMPL implant 1 each by Subdermal route once.     omeprazole (PRILOSEC) 20 MG capsule Take 1 capsule (20 mg total) by mouth daily. 30 capsule 3   No facility-administered medications prior to visit.    Allergies  Allergen Reactions   Azithromycin Itching and Rash    Only seen with IV usage not po medication.   Penicillin G Rash    Review of Systems  Constitutional: Negative.   Respiratory: Negative.    Cardiovascular: Negative.   Gastrointestinal:  Positive for abdominal pain and nausea. Negative for constipation and diarrhea.  Genitourinary:  Positive for vaginal bleeding. Negative for dysuria and frequency.  Skin: Negative.   Neurological: Negative.       Objective:    Physical Exam Vitals and nursing note reviewed.  Constitutional:      General: She is not in acute distress.    Appearance: Normal appearance.  HENT:  Head: Normocephalic.  Eyes:     Conjunctiva/sclera: Conjunctivae normal.  Cardiovascular:     Rate and Rhythm: Normal rate and regular rhythm.     Pulses: Normal pulses.     Heart sounds: Normal heart sounds.  Pulmonary:     Effort: Pulmonary effort is normal.     Breath sounds: Normal breath sounds.  Abdominal:     General: There is no distension.     Palpations: Abdomen is soft. There is no mass.     Tenderness: There is abdominal tenderness (mild tenderness to RLQ and LLQ). There is no guarding or rebound.  Musculoskeletal:     Cervical back: Normal range of motion.  Skin:    General: Skin is warm.  Neurological:     General: No focal deficit present.     Mental Status: She is alert and oriented to person, place, and time.  Psychiatric:        Mood and Affect: Mood normal.        Behavior:  Behavior normal.        Thought Content: Thought content normal.        Judgment: Judgment normal.    BP 119/84 (BP Location: Left Arm, Patient Position: Sitting)   Pulse 66   Temp 99.9 F (37.7 C) (Oral)   Resp 18   Wt 105 lb 12.8 oz (48 kg)   LMP 11/03/2021 (Exact Date) Comment: bleeding for the last 3 months  SpO2 100%   BMI 19.99 kg/m  Wt Readings from Last 3 Encounters:  11/03/21 105 lb 12.8 oz (48 kg)  01/06/21 110 lb (49.9 kg)  10/21/20 109 lb (49.4 kg)    Health Maintenance Due  Topic Date Due   Pneumococcal Vaccine 51-75 Years old (1 - PCV) Never done   CHLAMYDIA SCREENING  10/15/2020   INFLUENZA VACCINE  07/28/2021    There are no preventive care reminders to display for this patient.   Lab Results  Component Value Date   TSH 0.868 10/16/2019   Lab Results  Component Value Date   WBC 8.0 01/06/2021   HGB 13.1 01/06/2021   HCT 37.6 01/06/2021   MCV 87.0 01/06/2021   PLT 427 (H) 01/06/2021   Lab Results  Component Value Date   NA 138 01/06/2021   K 4.2 01/06/2021   CO2 24 01/06/2021   GLUCOSE 91 01/06/2021   BUN 15 01/06/2021   CREATININE 0.58 01/06/2021   BILITOT 0.9 01/06/2021   ALKPHOS 47 01/06/2021   AST 18 01/06/2021   ALT 12 01/06/2021   PROT 8.0 01/06/2021   ALBUMIN 4.4 01/06/2021   CALCIUM 9.5 01/06/2021   ANIONGAP 10 01/06/2021   Lab Results  Component Value Date   CHOL 175 10/16/2019   Lab Results  Component Value Date   HDL 55 10/16/2019   Lab Results  Component Value Date   LDLCALC 108 (H) 10/16/2019   Lab Results  Component Value Date   TRIG 62 10/16/2019   No results found for: CHOLHDL Lab Results  Component Value Date   HGBA1C 5.4 10/23/2016       Assessment & Plan:   Problem List Items Addressed This Visit   None Visit Diagnoses     Vaginal bleeding    -  Primary   Urine pregnancy negative. In office Hgb 12.9. Will check gonorhrea/chlamydia per request. Keep appointment with GYN on Friday. ER precautions  discussed.   Relevant Orders   CBC With Differential/Platelet   Pregnancy, urine  Chlamydia/GC NAA, Confirmation        No orders of the defined types were placed in this encounter.    Gerre Scull, NP

## 2021-11-03 NOTE — Telephone Encounter (Signed)
Patient had an appointment with Lauren today 11/03/21 at 11:20 am.

## 2021-11-03 NOTE — Telephone Encounter (Signed)
Pt called in stating she has been on her period for over 3 months and today is experiencing heavy bleeding with quarter sized clots. Pt says that her periods are typically normal. The heavy bleeding started yesterday and isnt any better today. She reports soaking a pad every 30 mins if shes doing any kind of activity but if shes just resting she can go up to 2 hours before having to change. She does have the explenon in her arm but is sexually active and doesn't use any other kind of protection. Pt says that she's been feeling this bubbly air feeling in her belly but isnt sure if its actually there or not. Pt doesn't think she can be pregnant. Denies having any pain but some mild discomfort in her ovary area bilaterally. Denies cramping as well. Pt has OBGYN appt on 11/07/21 at 1345 but didn't know if she needed to be seen before. Appt scheduled for today at 1120 with Lauren, NP. Care advice given and pt verbalized understanding.   Reason for Disposition  MODERATE vaginal bleeding (e.g., soaking 1 pad or tampon per hour and present > 6 hours; 1 menstrual cup every 6 hours)  Answer Assessment - Initial Assessment Questions 1. AMOUNT: "Describe the bleeding that you are having."    - SPOTTING: spotting, or pinkish / brownish mucous discharge; does not fill panty liner or pad    - MILD:  less than 1 pad / hour; less than patient's usual menstrual bleeding   - MODERATE: 1-2 pads / hour; 1 menstrual cup every 6 hours; small-medium blood clots (e.g., pea, grape, small coin)   - SEVERE: soaking 2 or more pads/hour for 2 or more hours; 1 menstrual cup every 2 hours; bleeding not contained by pads or continuous red blood from vagina; large blood clots (e.g., golf ball, large coin)      Change every 30 mins with movement, when resting changing about every 2 hours 2. ONSET: "When did the bleeding begin?" "Is it continuing now?"     Had  period for about 3 months but since this month has been heavier and yesterday  and today flow has been really heavy and soaking thru pads 3. MENSTRUAL PERIOD: "When was the last normal menstrual period?" "How is this different than your period?"     Last month was normal  4. REGULARITY: "How regular are your periods?"     For the most part 5. ABDOMINAL PAIN: "Do you have any pain?" "How bad is the pain?"  (e.g., Scale 1-10; mild, moderate, or severe)   - MILD (1-3): doesn't interfere with normal activities, abdomen soft and not tender to touch    - MODERATE (4-7): interferes with normal activities or awakens from sleep, abdomen tender to touch    - SEVERE (8-10): excruciating pain, doubled over, unable to do any normal activities      mild 6. PREGNANCY: "Could you be pregnant?" "Are you sexually active?" "Did you recently give birth?"     On birth control, sexually active 7. BREASTFEEDING: "Are you breastfeeding?"     N/A 8. HORMONES: "Are you taking any hormone medications, prescription or OTC?" (e.g., birth control pills, estrogen)     Birth control 9. BLOOD THINNERS: "Do you take any blood thinners?" (e.g., Coumadin/warfarin, Pradaxa/dabigatran, aspirin)     No 10. CAUSE: "What do you think is causing the bleeding?" (e.g., recent gyn surgery, recent gyn procedure; known bleeding disorder, cervical cancer, polycystic ovarian disease, fibroids)  NO 11. HEMODYNAMIC STATUS: "Are you weak or feeling lightheaded?" If Yes, ask: "Can you stand and walk normally?"        Weak  12. OTHER SYMPTOMS: "What other symptoms are you having with the bleeding?" (e.g., passed tissue, vaginal discharge, fever, menstrual-type cramps)       Heavy clots, quarter sized clots  Protocols used: Vaginal Bleeding - Abnormal-A-AH

## 2021-11-06 LAB — CHLAMYDIA/GC NAA, CONFIRMATION
Chlamydia trachomatis, NAA: NEGATIVE
Neisseria gonorrhoeae, NAA: NEGATIVE

## 2021-11-07 ENCOUNTER — Encounter: Payer: Self-pay | Admitting: Obstetrics and Gynecology

## 2021-11-07 ENCOUNTER — Ambulatory Visit: Payer: Medicaid Other | Admitting: Obstetrics and Gynecology

## 2021-11-07 ENCOUNTER — Other Ambulatory Visit: Payer: Self-pay

## 2021-11-07 VITALS — BP 98/60 | HR 67 | Resp 16 | Ht 61.0 in | Wt 108.4 lb

## 2021-11-07 DIAGNOSIS — Z539 Procedure and treatment not carried out, unspecified reason: Secondary | ICD-10-CM

## 2021-11-07 DIAGNOSIS — Z975 Presence of (intrauterine) contraceptive device: Secondary | ICD-10-CM

## 2021-11-07 NOTE — Progress Notes (Signed)
      GYNECOLOGY OFFICE PROCEDURE NOTE  Carly Singleton is a 25 y.o. [phone] here for Nexplanon removal.  Patient is a 25 y.o. She wants to have her nexplanon removed because of irregular bleeding and clotting. Her left arm intermittently becomes numb.  Nexplanon placed 10/03/2019.  Nexplanon insertion.  Last pap smear was on 41/2020 and was normal.  No other gynecologic concerns. Is planningon transitioning to OCPs.    Nexplanon Removal Patient identified, informed consent performed, consent signed.   Appropriate time out taken. Nexplanon attempted to be palpated, but unable to do so.  The previous insertion site was identified by the scar.  Area prepped in usual sterile fashon. Three ml of 1% lidocaine was used to anesthetize the area at the suspected distal end of the implant. A small stab incision was made.  Several attempts were made to locate the Nexplanon implant without success. Another 3 ml of 1% lidocaine was injected around the incision for post-procedure analgesia.  Steri-strips were applied over the small incision.  A pressure bandage was applied to reduce any bruising.  The patient was instructed that she would need an X-ray of her arm to identify the Nexplanon.  Will order.  Patient is planning to use combined OCPs for contraception once Nexplanon is removed.  Hildred Laser, MD [facility]

## 2021-11-12 ENCOUNTER — Ambulatory Visit
Admission: RE | Admit: 2021-11-12 | Discharge: 2021-11-12 | Disposition: A | Discharge status: Home / Self Care | Payer: Medicaid Other | Attending: Obstetrics and Gynecology | Admitting: Obstetrics and Gynecology

## 2021-11-12 ENCOUNTER — Ambulatory Visit
Admission: RE | Admit: 2021-11-12 | Discharge: 2021-11-12 | Disposition: A | Discharge status: Home / Self Care | Payer: Medicaid Other | Source: Ambulatory Visit | Attending: Obstetrics and Gynecology | Admitting: Obstetrics and Gynecology

## 2021-11-12 DIAGNOSIS — Z975 Presence of (intrauterine) contraceptive device: Secondary | ICD-10-CM

## 2021-11-13 ENCOUNTER — Ambulatory Visit
Admission: RE | Admit: 2021-11-13 | Discharge: 2021-11-13 | Disposition: A | Discharge status: Home / Self Care | Payer: Medicaid Other | Source: Ambulatory Visit | Attending: Obstetrics and Gynecology | Admitting: Obstetrics and Gynecology

## 2021-11-13 ENCOUNTER — Other Ambulatory Visit: Payer: Self-pay

## 2021-11-13 ENCOUNTER — Ambulatory Visit
Admission: RE | Admit: 2021-11-13 | Discharge: 2021-11-13 | Disposition: A | Discharge status: Home / Self Care | Payer: Medicaid Other | Attending: Obstetrics and Gynecology | Admitting: Obstetrics and Gynecology

## 2021-11-13 DIAGNOSIS — Z975 Presence of (intrauterine) contraceptive device: Secondary | ICD-10-CM | POA: Diagnosis not present

## 2021-11-13 DIAGNOSIS — Z0389 Encounter for observation for other suspected diseases and conditions ruled out: Secondary | ICD-10-CM | POA: Diagnosis not present

## 2021-11-16 ENCOUNTER — Other Ambulatory Visit: Payer: Self-pay | Admitting: Obstetrics and Gynecology

## 2021-11-16 DIAGNOSIS — Z975 Presence of (intrauterine) contraceptive device: Secondary | ICD-10-CM

## 2021-11-18 ENCOUNTER — Telehealth: Payer: Self-pay | Admitting: Obstetrics and Gynecology

## 2021-11-18 NOTE — Telephone Encounter (Signed)
Pt is calling in stating that she has not heard anything back from the xray that she had done.  Pt would like to have a call back stated that she is not able to feel her elbow or some of her fingers.

## 2021-11-19 NOTE — Telephone Encounter (Signed)
I had sent patient a Mychart message regarding her results, and informed her that I was going to send her to General Surgery for her Nexplanon removal.

## 2021-11-24 NOTE — Telephone Encounter (Signed)
Called patient to inform her about referral to General Surgery. She has not heard anything from them yet. I told her if she does not hear anything by the end of the week to call us back.

## 2021-11-27 ENCOUNTER — Ambulatory Visit: Payer: Medicaid Other | Admitting: Surgery

## 2021-12-02 ENCOUNTER — Ambulatory Visit (INDEPENDENT_AMBULATORY_CARE_PROVIDER_SITE_OTHER): Payer: Medicaid Other | Admitting: Surgery

## 2021-12-02 ENCOUNTER — Encounter: Payer: Self-pay | Admitting: Surgery

## 2021-12-02 ENCOUNTER — Telehealth: Payer: Self-pay | Admitting: Obstetrics and Gynecology

## 2021-12-02 ENCOUNTER — Other Ambulatory Visit: Payer: Self-pay

## 2021-12-02 VITALS — BP 105/62 | HR 61 | Temp 98.0°F | Ht 61.0 in | Wt 109.6 lb

## 2021-12-02 DIAGNOSIS — N926 Irregular menstruation, unspecified: Secondary | ICD-10-CM

## 2021-12-02 DIAGNOSIS — Z30011 Encounter for initial prescription of contraceptive pills: Secondary | ICD-10-CM

## 2021-12-02 DIAGNOSIS — S40852A Superficial foreign body of left upper arm, initial encounter: Secondary | ICD-10-CM

## 2021-12-02 DIAGNOSIS — R200 Anesthesia of skin: Secondary | ICD-10-CM

## 2021-12-02 DIAGNOSIS — Z3046 Encounter for surveillance of implantable subdermal contraceptive: Secondary | ICD-10-CM

## 2021-12-02 NOTE — Patient Instructions (Addendum)
We have removed your implant in our office today.  You have sutures under the skin that will dissolve and also dermabond (skin glue) on top of your skin which will come off on it's own in 10-14 days.  You may shower tomorrow, but do not rub or scrub at the area.  You may use Ibuprofen or Tylenol for pain control. Use ice to the area today and tomorrow several times a day for discomfort.   Avoid Strenuous activities that will make you sweat during the next 48 hours to avoid the glue coming off prematurely. Avoid activities that will place pressure to this area of the body for 1-2 weeks to avoid re-injury to incision site.     Removal of implant  Various excision or surgical techniques may be used depending on your condition, the location of the implant, and your overall health. Tell a health care provider about: Any allergies you have. All medicines you are taking, including vitamins, herbs, eye drops, creams, and over-the-counter medicines. Any problems you or family members have had with anesthetic medicines. Any blood disorders you have. Any surgeries you have had. Any medical conditions you have. Whether you are pregnant or may be pregnant. What are the risks? Generally, this is a safe procedure. However, problems may occur, including: Bleeding. Infection. Scarring. Changes in skin sensation or appearance, such as discoloration or swelling. Reaction to the anesthetics. Allergic reaction to surgical materials or ointments. Damage to nerves, blood vessels, muscles, or other structures. Continued pain.

## 2021-12-02 NOTE — Telephone Encounter (Signed)
Pt had nexplanon removed today- she is requesting birth control pills be called in to [facility] pharmacy on [address]. Please Advise.

## 2021-12-02 NOTE — Progress Notes (Signed)
Patient ID: Carly Singleton, female   DOB: [birth year], 24 y.o.   MRN: [id number]  Chief Complaint: Retained Nexplanon implant left medial arm.  History of Present Illness Carly Singleton is a 25 y.o. female with desired removal of implant.  She wants to have her nexplanon removed because of irregular bleeding and clotting. Her left arm intermittently becomes numb.  Nexplanon placed 10/03/2019.     Past Medical History 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      Past Surgical History:  Procedure Laterality Date   NO PAST SURGERIES      Allergies  Allergen Reactions   Azithromycin Itching and Rash    Only seen with IV usage not po medication.   Penicillin G Rash    Current Outpatient Medications  Medication Sig Dispense Refill   baclofen (LIORESAL) 10 MG tablet Take 10 mg by mouth 3 (three) times daily.     ondansetron (ZOFRAN) 4 MG tablet Take 4 mg by mouth every 8 (eight) hours as needed for nausea or vomiting.     No current facility-administered medications for this visit.    Family History Family History  Problem Relation Age of Onset   Healthy Mother    Healthy Father    Down syndrome Daughter    Cancer Neg Hx    Diabetes Neg Hx    Heart disease Neg Hx       Social History Social History   Tobacco Use   Smoking status: Never   Smokeless tobacco: Never  Vaping Use   Vaping Use: Never used  Substance Use Topics   Alcohol use: Yes    Comment: monthly   Drug use: Yes    Types: Marijuana        Review of Systems  Constitutional: Negative.   HENT: Negative.    Eyes: Negative.   Respiratory: Negative.    Cardiovascular: Negative.   Gastrointestinal: Negative.   Genitourinary: Negative.   Skin: Negative.   Neurological:  Positive for tingling.  Psychiatric/Behavioral: Negative.       Physical Exam Blood pressure  105/62, pulse 61, temperature 98 F (36.7 C), height 5\' 1"  (1.549 m), weight 109 lb 9.6 oz (49.7 kg), last menstrual period 11/03/2021, SpO2 98 %. Last Weight  Most recent update: 12/02/2021  3:39 PM    Weight  49.7 kg (109 lb 9.6 oz)             CONSTITUTIONAL: Well developed, and nourished, appropriately responsive and aware without distress.   EYES: Sclera non-icteric.   EARS, NOSE, MOUTH AND THROAT: Mask worn.   Hearing is intact to voice.  NECK: Trachea is midline, and there is no jugular venous distension.  LYMPH NODES:  Lymph nodes in the neck are not enlarged. RESPIRATORY:  Lungs are clear, and breath sounds are equal bilaterally. Normal respiratory effort without pathologic use of accessory muscles. CARDIOVASCULAR: Heart is regular in rate and rhythm. MUSCULOSKELETAL:  Symmetrical muscle tone appreciated in all four extremities.    SKIN: Skin turgor is normal. No pathologic skin lesions appreciated.  Subcentimeter transverse scar medial left arm.  I cannot definitively palpate the implant. NEUROLOGIC:  Motor and sensation appear grossly normal.  Cranial nerves are grossly without defect. PSYCH:  Alert and oriented to person, place and time. Affect is appropriate for situation.  Data Reviewed I have personally reviewed what  is currently available of the patient's imaging, recent labs and medical records.   Labs:  CBC Latest Ref Rng & Units 11/03/2021 01/06/2021 10/16/2019  WBC 3.4 - 10.8 x10E3/uL 7.0 8.0 6.6  Hemoglobin 11.1 - 15.9 g/dL 12.9 13.1 12.4  Hematocrit 34.0 - 46.6 % 36.4 37.6 39.0  Platelets 150 - 450 x10E3/uL 353 427(H) 372   CMP Latest Ref Rng & Units 01/06/2021 10/16/2019 05/22/2018  Glucose 70 - 99 mg/dL 91 86 116(H)  BUN 6 - 20 mg/dL 15 12 8   Creatinine 0.44 - 1.00 mg/dL 0.58 0.60 0.70  Sodium 135 - 145 mmol/L 138 139 133(L)  Potassium 3.5 - 5.1 mmol/L 4.2 4.1 3.5  Chloride 98 - 111 mmol/L 104 106 101  CO2 22 - 32 mmol/L 24 19(L) 24  Calcium 8.9 - 10.3 mg/dL  9.5 9.5 9.1  Total Protein 6.5 - 8.1 g/dL 8.0 8.0 8.9(H)  Total Bilirubin 0.3 - 1.2 mg/dL 0.9 0.5 1.0  Alkaline Phos 38 - 126 U/L 47 53 56  AST 15 - 41 U/L 18 51(H) 21  ALT 0 - 44 U/L 12 39(H) 11(L)      Imaging:  I reviewed the plain x-rays confirming the presence of the implant of the medial distal left upper arm. Within last 24 hrs: No results found.  Assessment    Retained, undesired implant.  Left medial upper arm. 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  After informed consent was obtained.  Patient was placed in #position with the left arm extended and rotated at the shoulder to allow exposure to the medial upper arm. Region was then prepped and draped in usual sterile manner.  Local infiltration with 1% lidocaine with epi is applied to adequate anesthetic effect.  My incision was longitudinal along the axis of the upper extremity starting at the transverse scar and extending proximally.  It was about 1-1/2 to 2 cm in length.  With a combination of blunt and sharp dissection was gradually able to identify a palpable tip of the implant.  This was then sharply excised from the encasement of scar tissue, and removed in its entirety.  Adequate hemostasis was assured.  The skin was closed with interrupted dermal's of 3-0 Vicryl.  The skin skin was then sealed with Dermabond.  She tolerated procedure well.  Face-to-face time spent with the patient and accompanying care providers(if present) was 30 minutes, with more than 50% of the time spent counseling, educating, and coordinating care of the patient.    These notes generated with voice recognition software. I apologize for typographical errors.  Ronny Bacon M.D., FACS 12/02/2021, 4:23 PM

## 2021-12-03 MED ORDER — NORETHIN ACE-ETH ESTRAD-FE 1-20 MG-MCG(24) PO TABS: 1.0000 | ORAL_TABLET | Freq: Every day | ORAL | 3 refills | Status: DC

## 2021-12-03 NOTE — Telephone Encounter (Signed)
Rx sent to pharmacy. Patient notified. 

## 2021-12-05 ENCOUNTER — Ambulatory Visit: Payer: Self-pay

## 2021-12-05 NOTE — Telephone Encounter (Signed)
The patient has experienced personal discomfort for nearly two weeks   The patient is experiencing an uncomfortable sensation when urinating and has noticed a slight odor in their urine   The patient would like to be prescribed something to help with their discomfort   Please contact further when possible    Chief Complaint: Pain, burning with urination x 4 weeks Symptoms: Pain, burning, strong smelling urine Frequency: 4 weeks ago Pertinent Negatives: Patient denies  Disposition: [] ED /[] Urgent Care (no appt availability in office) / [x] Appointment(In office/virtual)/ []  [place] [facility]/ [] Home Care/ [] Refused Recommended Disposition  Additional Notes: No availability today. Appointment made for Monday. Instructed to go to UC for worsening of symptoms.    Reason for Disposition  Bad or foul-smelling urine  Answer Assessment - Initial Assessment Questions 1. SYMPTOM: "What's the main symptom you're concerned about?" (e.g., frequency, incontinence)     Pain, irritation, burning 2. ONSET: "When did the  symptoms  start?"     4 weeks ago 3. PAIN: "Is there any pain?" If Yes, ask: "How bad is it?" (Scale: 1-10; mild, moderate, severe)     5-6 4. CAUSE: "What do you think is causing the symptoms?"     UTI 5. OTHER SYMPTOMS: "Do you have any other symptoms?" (e.g., fever, flank pain, blood in urine, pain with urination)     Pain 6. PREGNANCY: "Is there any chance you are pregnant?" "When was your last menstrual period?"     No  Protocols used: Urinary Symptoms-A-AH

## 2021-12-08 ENCOUNTER — Ambulatory Visit: Payer: Medicaid Other | Admitting: Family Medicine

## 2021-12-08 DIAGNOSIS — R300 Dysuria: Secondary | ICD-10-CM

## 2021-12-09 ENCOUNTER — Encounter: Payer: Medicaid Other | Admitting: Family Medicine

## 2022-04-30 ENCOUNTER — Emergency Department
Admission: EM | Admit: 2022-04-30 | Discharge: 2022-04-30 | Discharge status: Against Medical Advice | Payer: Medicaid Other | Attending: Student | Admitting: Student

## 2022-04-30 ENCOUNTER — Other Ambulatory Visit: Payer: Self-pay

## 2022-04-30 ENCOUNTER — Emergency Department: Payer: Medicaid Other

## 2022-04-30 DIAGNOSIS — N939 Abnormal uterine and vaginal bleeding, unspecified: Secondary | ICD-10-CM | POA: Diagnosis not present

## 2022-04-30 DIAGNOSIS — O209 Hemorrhage in early pregnancy, unspecified: Secondary | ICD-10-CM | POA: Diagnosis not present

## 2022-04-30 DIAGNOSIS — R109 Unspecified abdominal pain: Secondary | ICD-10-CM | POA: Diagnosis not present

## 2022-04-30 DIAGNOSIS — R42 Dizziness and giddiness: Secondary | ICD-10-CM | POA: Insufficient documentation

## 2022-04-30 DIAGNOSIS — Z3A01 Less than 8 weeks gestation of pregnancy: Secondary | ICD-10-CM | POA: Diagnosis not present

## 2022-04-30 DIAGNOSIS — Z5321 Procedure and treatment not carried out due to patient leaving prior to being seen by health care provider: Secondary | ICD-10-CM | POA: Diagnosis not present

## 2022-04-30 LAB — CBC WITH DIFFERENTIAL/PLATELET
Abs Immature Granulocytes: 0.04 10*3/uL (ref 0.00–0.07)
Basophils Absolute: 0.1 10*3/uL (ref 0.0–0.1)
Basophils Relative: 1 %
Eosinophils Absolute: 0.1 10*3/uL (ref 0.0–0.5)
Eosinophils Relative: 1 %
HCT: 36.7 % (ref 36.0–46.0)
Hemoglobin: 12.3 g/dL (ref 12.0–15.0)
Immature Granulocytes: 1 %
Lymphocytes Relative: 26 %
Lymphs Abs: 2.2 10*3/uL (ref 0.7–4.0)
MCH: 29 pg (ref 26.0–34.0)
MCHC: 33.5 g/dL (ref 30.0–36.0)
MCV: 86.6 fL (ref 80.0–100.0)
Monocytes Absolute: 0.5 10*3/uL (ref 0.1–1.0)
Monocytes Relative: 6 %
Neutro Abs: 5.5 10*3/uL (ref 1.7–7.7)
Neutrophils Relative %: 65 %
Platelets: 452 10*3/uL — ABNORMAL HIGH (ref 150–400)
RBC: 4.24 MIL/uL (ref 3.87–5.11)
RDW: 13 % (ref 11.5–15.5)
WBC: 8.4 10*3/uL (ref 4.0–10.5)
nRBC: 0 % (ref 0.0–0.2)

## 2022-04-30 LAB — COMPREHENSIVE METABOLIC PANEL
ALT: 19 U/L (ref 0–44)
AST: 23 U/L (ref 15–41)
Albumin: 4.1 g/dL (ref 3.5–5.0)
Alkaline Phosphatase: 45 U/L (ref 38–126)
Anion gap: 6 (ref 5–15)
BUN: 8 mg/dL (ref 6–20)
CO2: 22 mmol/L (ref 22–32)
Calcium: 9.4 mg/dL (ref 8.9–10.3)
Chloride: 105 mmol/L (ref 98–111)
Creatinine, Ser: 0.45 mg/dL (ref 0.44–1.00)
GFR, Estimated: >60 mL/min (ref >60)
Glucose, Bld: 108 mg/dL — ABNORMAL HIGH (ref 70–99)
Potassium: 3.7 mmol/L (ref 3.5–5.1)
Sodium: 133 mmol/L — ABNORMAL LOW (ref 135–145)
Total Bilirubin: 0.9 mg/dL (ref 0.3–1.2)
Total Protein: 7.6 g/dL (ref 6.5–8.1)

## 2022-04-30 LAB — URINALYSIS, ROUTINE W REFLEX MICROSCOPIC
Bilirubin Urine: NEGATIVE
Glucose, UA: NEGATIVE mg/dL
Hgb urine dipstick: NEGATIVE
Ketones, ur: 20 mg/dL — AB
Leukocytes,Ua: NEGATIVE
Nitrite: NEGATIVE
Protein, ur: NEGATIVE mg/dL
Specific Gravity, Urine: 1.019 (ref 1.005–1.030)
pH: 8 (ref 5.0–8.0)

## 2022-04-30 LAB — ABO/RH: ABO/RH(D): O POS

## 2022-04-30 LAB — HCG, QUANTITATIVE, PREGNANCY: hCG, Beta Chain, Quant, S: 22043 m[IU]/mL — ABNORMAL HIGH (ref <5)

## 2022-04-30 LAB — POC URINE PREG, ED: Preg Test, Ur: POSITIVE — AB

## 2022-04-30 NOTE — ED Notes (Signed)
This pt was called several times  no answer  I spoke with this pt and she left so she could get to work   ?

## 2022-04-30 NOTE — ED Triage Notes (Signed)
Pt here with lower abd pain that started 2 weeks ago. Pt also states she feels like her heart is racing. Pt believes that she may be 4-[redacted] weeks pregnant but has not been seen by an OB. Pt having vaginal spotting, pt denies nausea. ?

## 2022-04-30 NOTE — ED Provider Triage Note (Signed)
Emergency Medicine Provider Triage Evaluation Note ? ?Carly Singleton , a 26 y.o. female  was evaluated in triage.  Pt complains of midline abdominal pain x2 weeks. LMP 02/27/21. +home pregnancy test 1 week. Vaginal spotting today. No vomiting.  Feels lightheaded. ? ?Review of Systems  ?Positive: Abdominal pain, lightheaded, vaginal spotting ?Negative: Fever, chest pain, SOB ? ?Physical Exam  ?There were no vitals taken for this visit. ?Gen:   Awake, no distress   ?Resp:  Normal effort  ?MSK:   Moves extremities without difficulty  ?Other:   ? ?Medical Decision Making  ?Medically screening exam initiated at 12:09 PM.  Appropriate orders placed.  Carly Singleton was informed that the remainder of the evaluation will be completed by another provider, this initial triage assessment does not replace that evaluation, and the importance of remaining in the ED until their evaluation is complete. ? ? ?  ?Jackelyn Hoehn, PA-C ?04/30/22 1213 ? ?

## 2022-05-11 ENCOUNTER — Emergency Department: Payer: Medicaid Other

## 2022-05-11 ENCOUNTER — Encounter: Payer: Self-pay | Admitting: *Deleted

## 2022-05-11 ENCOUNTER — Other Ambulatory Visit: Payer: Self-pay

## 2022-05-11 ENCOUNTER — Emergency Department
Admission: EM | Admit: 2022-05-11 | Discharge: 2022-05-12 | Disposition: A | Discharge status: Home / Self Care | Payer: Medicaid Other | Attending: Emergency Medicine | Admitting: Emergency Medicine

## 2022-05-11 DIAGNOSIS — O200 Threatened abortion: Secondary | ICD-10-CM | POA: Diagnosis not present

## 2022-05-11 DIAGNOSIS — O4690 Antepartum hemorrhage, unspecified, unspecified trimester: Secondary | ICD-10-CM

## 2022-05-11 DIAGNOSIS — Z3A08 8 weeks gestation of pregnancy: Secondary | ICD-10-CM | POA: Diagnosis not present

## 2022-05-11 DIAGNOSIS — O209 Hemorrhage in early pregnancy, unspecified: Secondary | ICD-10-CM | POA: Diagnosis not present

## 2022-05-11 DIAGNOSIS — Z3A01 Less than 8 weeks gestation of pregnancy: Secondary | ICD-10-CM | POA: Diagnosis not present

## 2022-05-11 LAB — COMPREHENSIVE METABOLIC PANEL
ALT: 11 U/L (ref 0–44)
AST: 16 U/L (ref 15–41)
Albumin: 4.1 g/dL (ref 3.5–5.0)
Alkaline Phosphatase: 41 U/L (ref 38–126)
Anion gap: 8 (ref 5–15)
BUN: 8 mg/dL (ref 6–20)
CO2: 23 mmol/L (ref 22–32)
Calcium: 9 mg/dL (ref 8.9–10.3)
Chloride: 104 mmol/L (ref 98–111)
Creatinine, Ser: 0.44 mg/dL (ref 0.44–1.00)
GFR, Estimated: >60 mL/min (ref >60)
Glucose, Bld: 94 mg/dL (ref 70–99)
Potassium: 3.5 mmol/L (ref 3.5–5.1)
Sodium: 135 mmol/L (ref 135–145)
Total Bilirubin: 0.7 mg/dL (ref 0.3–1.2)
Total Protein: 7.4 g/dL (ref 6.5–8.1)

## 2022-05-11 LAB — ABO/RH: ABO/RH(D): O POS

## 2022-05-11 LAB — CBC
HCT: 34 % — ABNORMAL LOW (ref 36.0–46.0)
Hemoglobin: 11.5 g/dL — ABNORMAL LOW (ref 12.0–15.0)
MCH: 29.1 pg (ref 26.0–34.0)
MCHC: 33.8 g/dL (ref 30.0–36.0)
MCV: 86.1 fL (ref 80.0–100.0)
Platelets: 414 10*3/uL — ABNORMAL HIGH (ref 150–400)
RBC: 3.95 MIL/uL (ref 3.87–5.11)
RDW: 12.7 % (ref 11.5–15.5)
WBC: 9.3 10*3/uL (ref 4.0–10.5)
nRBC: 0 % (ref 0.0–0.2)

## 2022-05-11 LAB — HCG, QUANTITATIVE, PREGNANCY: hCG, Beta Chain, Quant, S: 101049 m[IU]/mL — ABNORMAL HIGH (ref <5)

## 2022-05-11 MED ORDER — ACETAMINOPHEN 500 MG PO TABS: 1000.0000 mg | ORAL_TABLET | Freq: Once | ORAL | Status: DC

## 2022-05-11 NOTE — ED Triage Notes (Signed)
Pt has vaginal bleeding.  Pt is approx [redacted] weeks pregnant.  Pt states abd cramping.  Denies urinary sx.  Pt alert.   ?

## 2022-05-11 NOTE — ED Provider Triage Note (Signed)
Emergency Medicine Provider Triage Evaluation Note ? ?Carly Singleton , a 26 y.o. female  was evaluated in triage.  Pt complains of vaginal cramping and bleeding today. Reports she is [redacted] weeks pregnant. ? ?Review of Systems  ?Positive: Vaginal cramping and bleeding ?Negative: Vomiting ? ?Physical Exam  ?BP (!) 99/52 (BP Location: Left Arm)   Pulse 61   Temp 98.4 ?F (36.9 ?C) (Oral)   Resp 18   Ht 5' (1.524 m)   Wt 52.2 kg   LMP 02/27/2022 (Exact Date)   SpO2 98%   BMI 22.46 kg/m?  ?Gen:   Awake, no distress   ?Resp:  Normal effort  ?MSK:   Moves extremities without difficulty  ?Other:   ? ?Medical Decision Making  ?Medically screening exam initiated at 7:00 PM.  Appropriate orders placed.  Carly Singleton was informed that the remainder of the evaluation will be completed by another provider, this initial triage assessment does not replace that evaluation, and the importance of remaining in the ED until their evaluation is complete. ?  ?Victorino Dike, FNP ?05/11/22 2251 ? ?

## 2022-05-11 NOTE — ED Provider Notes (Signed)
? ?[facility] ?Provider Note ? ? ? Event Date/Time  ? First MD Initiated Contact with Patient 05/11/22 2139   ?  (approximate) ? ? ?History  ? ?Chief Complaint ?Vaginal Bleeding and Abdominal Pain ? ? ?HPI ? ?Carly Singleton is a 26 y.o. female, G3P2002 at approximately 8 weeks of pregnancy presents to the ED complaining of vaginal bleeding.  Patient reports that she was on her way home from work earlier this evening when she felt a gush of warm fluid from her vaginal area, subsequently noticed that she was bleeding.  She denies any associated discharge and has not noticed the passage of any tissue.  She does report crampy pain in her pelvic area that is currently mild.  She denies any dysuria, flank pain, fever, nausea, or vomiting.  She believes she is about [redacted] weeks pregnant but has not yet been seen by an OB/GYN. ?  ? ? ?Physical Exam  ? ?Triage Vital Signs: ?ED Triage Vitals  ?Enc Vitals Group  ?   BP 05/11/22 1856 (!) 99/52  ?   Pulse Rate 05/11/22 1856 61  ?   Resp 05/11/22 1856 18  ?   Temp 05/11/22 1856 98.4 ?F (36.9 ?C)  ?   Temp Source 05/11/22 1856 Oral  ?   SpO2 05/11/22 1856 98 %  ?   Weight 05/11/22 1854 115 lb (52.2 kg)  ?   Height 05/11/22 1854 5' (1.524 m)  ?   Head Circumference --   ?   Peak Flow --   ?   Pain Score 05/11/22 1854 6  ?   Pain Loc --   ?   Pain Edu? --   ?   Excl. in GC? --   ? ? ?Most recent vital signs: ?Vitals:  ? 05/11/22 1856  ?BP: (!) 99/52  ?Pulse: 61  ?Resp: 18  ?Temp: 98.4 ?F (36.9 ?C)  ?SpO2: 98%  ? ? ?Constitutional: Alert and oriented. ?Eyes: Conjunctivae are normal. ?Head: Atraumatic. ?Nose: No congestion/rhinnorhea. ?Mouth/Throat: Mucous membranes are moist.  ?Cardiovascular: Normal rate, regular rhythm. Grossly normal heart sounds.  2+ radial pulses bilaterally. ?Respiratory: Normal respiratory effort.  No retractions. Lungs CTAB. ?Gastrointestinal: Soft and nontender. No distention. ?Musculoskeletal: No lower extremity tenderness nor  edema.  ?Neurologic:  Normal speech and language. No gross focal neurologic deficits are appreciated. ? ? ? ?ED Results / Procedures / Treatments  ? ?Labs ?(all labs ordered are listed, but only abnormal results are displayed) ?Labs Reviewed  ?CBC - Abnormal; Notable for the following components:  ?    Result Value  ? Hemoglobin 11.5 (*)   ? HCT 34.0 (*)   ? Platelets 414 (*)   ? All other components within normal limits  ?HCG, QUANTITATIVE, PREGNANCY - Abnormal; Notable for the following components:  ? hCG, Beta Chain, Quant, S 101,049 (*)   ? All other components within normal limits  ?COMPREHENSIVE METABOLIC PANEL  ?ABO/RH  ? ?RADIOLOGY ?Obstetric ultrasound reviewed and interpreted by me shows an intrauterine gestational sac with embryo. ? ?PROCEDURES: ? ?Critical Care performed: No ? ?Procedures ? ? ?MEDICATIONS ORDERED IN ED: ?Medications  ?acetaminophen (TYLENOL) tablet 1,000 mg (has no administration in time range)  ? ? ? ?IMPRESSION / MDM / ASSESSMENT AND PLAN / ED COURSE  ?I reviewed the triage vital signs and the nursing notes. ?             ?               ? ?  26 y.o. female, G3P2002 at approximately 8 weeks of pregnancy presents to the ED with vaginal bleeding and pelvic pain starting earlier this evening while she was on her way home from work. ? ?Differential diagnosis includes, but is not limited to, threatened miscarriage, completed miscarriage, anemia, UTI, molar pregnancy. ? ?Patient well-appearing and in no acute distress, vital signs are unremarkable and she has a benign abdominal exam.  Labs are reassuring with hemoglobin comparable to previous, BMP without AKI or electrolyte abnormality.  Patient is Rh+ and there is no indication for RhoGAM.  hCG levels are appropriately elevated and ultrasound shows intrauterine pregnancy with small subchorionic hemorrhage.  Symptoms appear consistent with threatened miscarriage and patient is appropriate for discharge home with OB/GYN follow-up.  She was  counseled to return to the ED for new worsening symptoms, patient agrees with plan. ? ?  ? ? ?FINAL CLINICAL IMPRESSION(S) / ED DIAGNOSES  ? ?Final diagnoses:  ?Threatened miscarriage  ?Vaginal bleeding in pregnancy  ? ? ? ?Rx / DC Orders  ? ?ED Discharge Orders   ? ? None  ? ?  ? ? ? ?Note:  This document was prepared using Dragon voice recognition software and may include unintentional dictation errors. ?  ?Chesley Noon, MD ?05/11/22 2320 ? ?

## 2022-05-13 ENCOUNTER — Telehealth: Payer: Self-pay

## 2022-05-13 NOTE — Telephone Encounter (Signed)
Transition Care Management Unsuccessful Follow-up Telephone Call ? ?Date of discharge and from where:  05/12/2022 from [facility] ? ?Attempts:  1st Attempt ? ?Reason for unsuccessful TCM follow-up call:  Left voice message ? ? ? ?

## 2022-05-15 NOTE — Telephone Encounter (Signed)
Transition Care Management Unsuccessful Follow-up Telephone Call  Date of discharge and from where:  05/12/2022 from [facility]  Attempts:  2nd Attempt  Reason for unsuccessful TCM follow-up call:  Left voice message

## 2022-05-18 NOTE — Telephone Encounter (Signed)
Transition Care Management Unsuccessful Follow-up Telephone Call  Date of discharge and from where:  05/12/2022 from [facility]  Attempts:  3rd Attempt  Reason for unsuccessful TCM follow-up call:  Unable to reach patient

## 2022-05-20 NOTE — Progress Notes (Deleted)
SUBJECTIVE  Calee Hugunin is a 26 y.o. female who presents for evaluation of amenorrhea and possible pregnancy. Her LMP was Patient's last menstrual period was 02/27/2022 (exact date).  {Normal/Abnormal:304960160}. She reports {Regular/irregular menstrual period abdominal pain hpi md:30583} {pregnancy desire:14500::"Pregnancy is desired."} Current symptoms include{pregnancy symptoms:18128}  Review of Systems {Ros; complete female:19594}  OBJECTIVE LMP 02/27/2022 (Exact Date)  There is no height or weight on file to calculate BMI.  {appearance:315021}  Lab Review Urine hCG:  ASSESSMENT Amenorrhea. Presumed pregnancy at [redacted]w[redacted]d with EDD of Estimated Date of Delivery: 12/22/22.  PLAN  Encouraged a well-balanced diet, rest, hydration, prenatal vitamins, and walking for exercise. Counseled to avoid alcohol, tobacco, and recreational drugs and to minimize caffeine intake. Safe medications list given. Discussed non-pharmacologic relief measures for nausea. Reviewed options of midwifery or physician care for prenatal care and birth; she plans to see midwives for this pregnancy Genetic screening options were discussed.  A dating [place] was offered and {not indicated/requested/declined:14582}. Return in *** for a nurse visit for labs and prenatal education, and *** for a NOB physical and genetic screening if desired.     Rubie Maid, MD [facility]

## 2022-05-26 ENCOUNTER — Encounter: Payer: Medicaid Other | Admitting: Obstetrics and Gynecology

## 2022-05-26 DIAGNOSIS — N912 Amenorrhea, unspecified: Secondary | ICD-10-CM

## 2022-06-27 HISTORY — PX: WISDOM TOOTH EXTRACTION: SHX21

## 2022-08-24 ENCOUNTER — Ambulatory Visit (INDEPENDENT_AMBULATORY_CARE_PROVIDER_SITE_OTHER): Payer: Medicaid Other | Admitting: Obstetrics & Gynecology

## 2022-08-24 ENCOUNTER — Encounter: Payer: Self-pay | Admitting: Obstetrics & Gynecology

## 2022-08-24 VITALS — BP 100/50 | Ht 61.0 in | Wt 112.2 lb

## 2022-08-24 DIAGNOSIS — F331 Major depressive disorder, recurrent, moderate: Secondary | ICD-10-CM | POA: Diagnosis not present

## 2022-08-24 DIAGNOSIS — N912 Amenorrhea, unspecified: Secondary | ICD-10-CM | POA: Diagnosis not present

## 2022-08-24 LAB — POCT URINE PREGNANCY: Preg Test, Ur: POSITIVE — AB

## 2022-08-24 NOTE — Progress Notes (Signed)
Subjective:    Carly Singleton is a 26 y.o. female who presents for evaluation of amenorrhea. She believes she could be pregnant. Pregnancy is desired.  Last period was Unkown. Patient reports she has not bleed since having an abortion in may after her ED visit. She does not recall the exact date of the abortion.   Patient's last menstrual period was 02/27/2022 (exact date).    Lab Review Urine HCG: positive    Assessment:    Absence of menstruation.     Plan:    Pregnancy Test:  Positive: EDC: Unknown. Briefly discussed positive results and sent to check out for scheduling of New OB appointments.

## 2022-08-28 ENCOUNTER — Telehealth: Payer: Self-pay

## 2022-08-28 NOTE — Telephone Encounter (Signed)
Patient contacted office requesting for advise. Patient states that she was seen in office on 08/24/22 for confirmation of pregnancy. Patient states that on 08/25/22 she began experiencing symptoms of shortness of breath on exertion or when laying down in supine position. Patient reports that she has had episodes of nausea and vomiting but states that when she vomits it appears to be acid. Patient states that her acid levels have increased since pregnancy. Patient states that she has noticed that when she takes naps or wakes up from naps she immediately feels light headed when sitting up. Advised patient that for symptoms of indigestion to avoid greasy or spicy foods, citrus foods/drinks, tomatoes, onions and caffeine. Patient advised to avoid eating 3 hours before going to bed and using a pillow to elevate head. Encouraged patient to take otc Tums or Mylanta to help with symptoms of acid reflux. Please review and advise for symptoms of shortness of breath. KW

## 2022-08-28 NOTE — Telephone Encounter (Signed)
I spoke to [place] on the phone. She states that she took some OTC antacids and all her symptoms have resolved. I reviewed physiologic changes of pregnancy and when to go to the ED.

## 2022-09-09 ENCOUNTER — Ambulatory Visit (INDEPENDENT_AMBULATORY_CARE_PROVIDER_SITE_OTHER): Payer: Medicaid Other

## 2022-09-09 ENCOUNTER — Other Ambulatory Visit: Payer: Self-pay

## 2022-09-09 DIAGNOSIS — Z3481 Encounter for supervision of other normal pregnancy, first trimester: Secondary | ICD-10-CM

## 2022-09-09 DIAGNOSIS — Z369 Encounter for antenatal screening, unspecified: Secondary | ICD-10-CM

## 2022-09-09 DIAGNOSIS — Z3480 Encounter for supervision of other normal pregnancy, unspecified trimester: Secondary | ICD-10-CM | POA: Insufficient documentation

## 2022-09-09 DIAGNOSIS — Z8639 Personal history of other endocrine, nutritional and metabolic disease: Secondary | ICD-10-CM

## 2022-09-09 NOTE — Progress Notes (Signed)
New OB Intake  I connected with  Carly Singleton on 09/09/22 at  9:15 AM EDT by telephone Video Visit and verified that I am speaking with the correct person using two identifiers. Nurse is located at [facility] and pt is located at work.  I explained I am completing New OB Intake today. We discussed her EDD of unknown that is based on LMP of unknown. Pt states she had a positive preg test in July.  Pt is G5/P2022. I reviewed her allergies, medications, Medical/Surgical/OB history, and appropriate screenings. Based on history, this is a/an pregnancy uncomplicated .   Patient Active Problem List   Diagnosis Date Noted   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    Concerns addressed today None  Delivery Plans:  Plans to deliver at [facility].  Anatomy US Explained first u/s will be for dating and will have an anatomy scan at 20 weeks.  Labs Discussed genetic screening with patient. Patient desires genetic testing to be drawn at 10 or more weeks. Discussed possible labs to be drawn at new OB appointment.  COVID Vaccine Patient has not had COVID vaccine.   Social Determinants of Health Food Insecurity: denies food insecurity Transportation: Patient denies transportation needs. Childcare: Discussed no children allowed at ultrasound appointments.   First visit review I reviewed new OB appt with pt. I explained she will have ob bloodwork and pap smear/pelvic exam if indicated. Explained pt will be seen by Dr. Nicholaus Bloom at first visit; encounter routed to appropriate provider.   Loran Senters, [facility] 09/09/2022  9:38 AM  Clinical Staff Provider  Office Location  [facility] Dating  Not found.  Language  English Anatomy US    Flu Vaccine  offer Genetic Screen  NIPS:   TDaP vaccine   offer Hgb A1C or  GTT Early : Third trimester :   Covid declines   LAB RESULTS   Rhogam  --/--/O POS Performed at [facility] [address]  [place], [place] [address]  [phone]  Blood Type --/--/O POS Performed at [facility], [address] [place], [place] [address]  [phone])   Feeding Plan yes Antibody    Contraception pill Rubella    Circumcision no RPR     Pediatrician  [facility]     Support Person Marcus HIV    Prenatal Classes yes Varicella     GBS  (For PCN allergy, check sensitivities)   BTL Consent  Hep C     VBAC Consent  Pap Diagnosis  Date Value Ref Range Status  03/29/2019   Final   NEGATIVE FOR INTRAEPITHELIAL LESIONS OR MALIGNANCY.      Hgb Electro      CF      SMA

## 2022-09-09 NOTE — Progress Notes (Signed)
I called her, but could not get her. We had an opening tomorrow at 4 for [place].  I took that spot for her.  Asked that she call back to confirm [place] appt.

## 2022-09-09 NOTE — Progress Notes (Signed)
Ok.  Randie Heinz! Didn't have a chance before the meeting to send a MyChart.   Thank you!

## 2022-09-10 ENCOUNTER — Ambulatory Visit: Payer: Medicaid Other

## 2022-09-10 DIAGNOSIS — Z3481 Encounter for supervision of other normal pregnancy, first trimester: Secondary | ICD-10-CM

## 2022-09-10 DIAGNOSIS — Z369 Encounter for antenatal screening, unspecified: Secondary | ICD-10-CM

## 2022-09-11 ENCOUNTER — Other Ambulatory Visit: Payer: Medicaid Other

## 2022-09-11 DIAGNOSIS — Z3481 Encounter for supervision of other normal pregnancy, first trimester: Secondary | ICD-10-CM | POA: Diagnosis not present

## 2022-09-11 DIAGNOSIS — Z369 Encounter for antenatal screening, unspecified: Secondary | ICD-10-CM

## 2022-09-12 LAB — CBC/D/PLT+RPR+RH+ABO+RUBIGG...
Antibody Screen: NEGATIVE
Basophils Absolute: 0.1 10*3/uL (ref 0.0–0.2)
Basos: 1 %
EOS (ABSOLUTE): 0 10*3/uL (ref 0.0–0.4)
Eos: 0 %
HCV Ab: NONREACTIVE
HIV Screen 4th Generation wRfx: NONREACTIVE
Hematocrit: 36.7 % (ref 34.0–46.6)
Hemoglobin: 11.6 g/dL (ref 11.1–15.9)
Hepatitis B Surface Ag: NEGATIVE
Immature Grans (Abs): 0 10*3/uL (ref 0.0–0.1)
Immature Granulocytes: 0 %
Lymphocytes Absolute: 1.7 10*3/uL (ref 0.7–3.1)
Lymphs: 19 %
MCH: 27.7 pg (ref 26.6–33.0)
MCHC: 31.6 g/dL (ref 31.5–35.7)
MCV: 88 fL (ref 79–97)
Monocytes Absolute: 0.4 10*3/uL (ref 0.1–0.9)
Monocytes: 4 %
Neutrophils Absolute: 7 10*3/uL (ref 1.4–7.0)
Neutrophils: 76 %
Platelets: 421 10*3/uL (ref 150–450)
RBC: 4.19 x10E6/uL (ref 3.77–5.28)
RDW: 13.9 % (ref 11.7–15.4)
RPR Ser Ql: NONREACTIVE
Rh Factor: POSITIVE
Rubella Antibodies, IGG: 2.54 index (ref >0.99)
Varicella zoster IgG: <135 index — ABNORMAL LOW (ref >165)
WBC: 9.2 10*3/uL (ref 3.4–10.8)

## 2022-09-12 LAB — HCV INTERPRETATION

## 2022-09-14 ENCOUNTER — Ambulatory Visit: Admission: RE | Admit: 2022-09-14 | Payer: Medicaid Other | Source: Ambulatory Visit

## 2022-09-15 ENCOUNTER — Encounter: Payer: Medicaid Other | Admitting: Obstetrics & Gynecology

## 2022-09-15 NOTE — Progress Notes (Signed)
Pt is scheduled for [place] on 9/21.  Originally scheduled for 9/14, but it had to be rescheduled.

## 2022-09-16 ENCOUNTER — Ambulatory Visit: Payer: Medicaid Other

## 2022-09-17 ENCOUNTER — Encounter: Payer: Medicaid Other | Admitting: Obstetrics & Gynecology

## 2022-09-17 ENCOUNTER — Telehealth: Payer: Self-pay | Admitting: Obstetrics & Gynecology

## 2022-09-17 NOTE — Telephone Encounter (Signed)
Reached out to pt to reschedule New OB appt that was scheduled for 9/21 at 2:55 with Dr. Langley Gauss.  Left message for pt to call back to reschedule.

## 2022-09-18 ENCOUNTER — Encounter: Payer: Self-pay | Admitting: Obstetrics & Gynecology

## 2022-09-18 NOTE — Telephone Encounter (Signed)
Reached out to pt (2x) to reschedule New OB appt that was scheduled for 9/21 at 2:55 with Dr. Langley Gauss.  Left message for pt to call back to reschedule.

## 2022-09-23 NOTE — Telephone Encounter (Signed)
Patient is rescheduled for 10/16/22 at 2:55 with JEG

## 2022-09-25 ENCOUNTER — Ambulatory Visit
Admission: RE | Admit: 2022-09-25 | Discharge: 2022-09-25 | Disposition: A | Discharge status: Home / Self Care | Payer: Medicaid Other | Source: Ambulatory Visit | Attending: Obstetrics & Gynecology | Admitting: Obstetrics & Gynecology

## 2022-09-25 ENCOUNTER — Other Ambulatory Visit: Payer: Self-pay | Admitting: Obstetrics & Gynecology

## 2022-09-25 DIAGNOSIS — Z369 Encounter for antenatal screening, unspecified: Secondary | ICD-10-CM

## 2022-09-25 DIAGNOSIS — O209 Hemorrhage in early pregnancy, unspecified: Secondary | ICD-10-CM | POA: Diagnosis not present

## 2022-09-25 DIAGNOSIS — Z3481 Encounter for supervision of other normal pregnancy, first trimester: Secondary | ICD-10-CM | POA: Insufficient documentation

## 2022-09-25 DIAGNOSIS — O26891 Other specified pregnancy related conditions, first trimester: Secondary | ICD-10-CM | POA: Diagnosis not present

## 2022-09-25 DIAGNOSIS — Z3A10 10 weeks gestation of pregnancy: Secondary | ICD-10-CM | POA: Insufficient documentation

## 2022-09-25 DIAGNOSIS — N83202 Unspecified ovarian cyst, left side: Secondary | ICD-10-CM | POA: Diagnosis not present

## 2022-09-25 DIAGNOSIS — O208 Other hemorrhage in early pregnancy: Secondary | ICD-10-CM | POA: Diagnosis not present

## 2022-09-28 ENCOUNTER — Encounter: Payer: Self-pay | Admitting: Obstetrics & Gynecology

## 2022-09-28 ENCOUNTER — Other Ambulatory Visit: Payer: Medicaid Other

## 2022-09-28 NOTE — Progress Notes (Signed)
    GYNECOLOGY PROGRESS NOTE  Subjective:    Patient ID: Carly Singleton, female    DOB: August 02, 1996, 26 y.o.   MRN: [id number]  HPI  Patient is a 26 y.o. [phone] female who presents for   The following portions of the patient's history were reviewed and updated as appropriate: allergies, current medications, past family history, past medical history, past social history, past surgical history, and problem list.  Review of Systems A comprehensive review of systems was negative.   Objective:   Blood pressure (!) 100/50, height 5\' 1"  (1.549 m), weight 112 lb 3.2 oz (50.9 kg). Body mass index is 21.2 kg/m. General appearance: alert, cooperative, and no distress Abdomen: soft, non-tender; bowel sounds normal; no masses,  no organomegaly Pelvic: cervix normal in appearance, external genitalia normal, no adnexal masses or tenderness, no cervical motion tenderness, rectovaginal septum normal, uterus normal size, shape, and consistency, and vagina normal without discharge Extremities: extremities normal, atraumatic, no cyanosis or edema Neurologic: Grossly normal   Assessment:   1. Amenorrhea      Plan:   1. Amenorrhea  - POCT urine pregnancy  Rosario Adie, MD  09/28/2022 6:18 AM

## 2022-09-28 NOTE — Progress Notes (Signed)
Subjective:     Carly Singleton is a 26 y.o. woman who presents for irregular menses. No LMP recorded (lmp unknown). Menarche age: 52. Periods are irregular, Dysmenorrhea:moderate, occurring premenstrually. Cyclic symptoms include: headache and irritability. Current contraception: none.History of infertility: no. History of abnormal Pap smear:   The following portions of the patient's history were reviewed and updated as appropriate: allergies, current medications, past family history, past medical history, past social history, past surgical history, and problem list.  Review of Systems A comprehensive review of systems was negative.     Objective:    BP (!) 100/50   Ht 5\' 1"  (1.549 m)   Wt 112 lb 3.2 oz (50.9 kg)   LMP  (LMP Unknown)   BMI 21.20 kg/m  General appearance: alert, cooperative, and no distress Abdomen: soft, non-tender; bowel sounds normal; no masses,  no organomegaly Pelvic: cervix normal in appearance, external genitalia normal, no adnexal masses or tenderness, no cervical motion tenderness, uterus normal size, shape, and consistency, and vagina normal without discharge Extremities: extremities normal, atraumatic, no cyanosis or edema Skin: Skin color, texture, turgor normal. No rashes or lesions    Assessment:    The patient has amenorrhea.    Plan:  POCT urine test  Rtn as scheduled   Rosario Adie, MD  09/28/2022 6:15 AM

## 2022-09-30 ENCOUNTER — Other Ambulatory Visit: Payer: Self-pay

## 2022-09-30 MED ORDER — ONDANSETRON HCL 4 MG PO TABS: 4.0000 mg | ORAL_TABLET | Freq: Three times a day (TID) | ORAL | 0 refills | Status: DC | PRN

## 2022-10-16 ENCOUNTER — Encounter: Payer: Medicaid Other | Admitting: Advanced Practice Midwife

## 2022-10-16 ENCOUNTER — Telehealth: Payer: Self-pay | Admitting: Advanced Practice Midwife

## 2022-10-16 NOTE — Telephone Encounter (Signed)
Reached out to pt to rescheduled NOB appt that was scheduled with JEG fon 10/16/2022 at 2:55.  Left message for pt to call back to reschedule.

## 2022-10-20 ENCOUNTER — Encounter: Payer: Self-pay | Admitting: Advanced Practice Midwife

## 2022-10-20 NOTE — Telephone Encounter (Signed)
Reached out to pt to reschedule NOB appt that was schedule with JEG on 10/16/2022 at 2:55.  Left message for pt to call back to reschedule.  Will send a MYChart Letter.

## 2022-12-03 ENCOUNTER — Encounter: Payer: Self-pay | Admitting: Physician Assistant

## 2022-12-03 ENCOUNTER — Ambulatory Visit: Payer: Medicaid Other | Admitting: Physician Assistant

## 2022-12-03 VITALS — BP 91/60 | HR 96 | Temp 98.2°F | Wt 110.6 lb

## 2022-12-03 DIAGNOSIS — N390 Urinary tract infection, site not specified: Secondary | ICD-10-CM | POA: Diagnosis not present

## 2022-12-03 DIAGNOSIS — Z113 Encounter for screening for infections with a predominantly sexual mode of transmission: Secondary | ICD-10-CM | POA: Diagnosis not present

## 2022-12-03 LAB — MICROSCOPIC EXAMINATION

## 2022-12-03 LAB — URINALYSIS, ROUTINE W REFLEX MICROSCOPIC
Bilirubin, UA: NEGATIVE
Glucose, UA: NEGATIVE
Ketones, UA: NEGATIVE
Nitrite, UA: NEGATIVE
Protein,UA: NEGATIVE
RBC, UA: NEGATIVE
Specific Gravity, UA: 1.025 (ref 1.005–1.030)
Urobilinogen, Ur: 0.2 mg/dL (ref 0.2–1.0)
pH, UA: 5.5 (ref 5.0–7.5)

## 2022-12-03 LAB — WET PREP FOR TRICH, YEAST, CLUE
Clue Cell Exam: NEGATIVE
Trichomonas Exam: NEGATIVE
Yeast Exam: NEGATIVE

## 2022-12-03 MED ORDER — NITROFURANTOIN MONOHYD MACRO 100 MG PO CAPS: 100.0000 mg | ORAL_CAPSULE | Freq: Two times a day (BID) | ORAL | 0 refills | Status: AC

## 2022-12-03 NOTE — Progress Notes (Signed)
Acute Office Visit   Patient: Carly Singleton   DOB: [birth year]   26 y.o. Female  MRN: [id number] Visit Date: 12/03/2022  Today's healthcare provider: Oswaldo Conroy Keoni Risinger, PA-C  Introduced myself to the patient as a Secondary school teacher and provided education on APPs in clinical practice.    Chief Complaint  Patient presents with   Urinary Tract Infection   Subjective    HPI    Reports she was having some burning with urination  She states she has had a UTI in the past and this feels similar  States this started about 3 days ago  Interventions: none Denies vaginal symptoms, pelvic pain and CVA tenderness, abdominal pain  She does want to check for STDs   She is approx [redacted] weeks pregnant per chart review of Ob notes   Medications: Outpatient Medications Prior to Visit  Medication Sig   baclofen (LIORESAL) 10 MG tablet Take 10 mg by mouth 3 (three) times daily. (Patient not taking: Reported on 09/09/2022)   Norethindrone Acetate-Ethinyl Estrad-FE (LOESTRIN 24 FE) 1-20 MG-MCG(24) tablet Take 1 tablet by mouth daily. (Patient not taking: Reported on 09/09/2022)   ondansetron (ZOFRAN) 4 MG tablet Take 1 tablet (4 mg total) by mouth every 8 (eight) hours as needed for nausea or vomiting.   No facility-administered medications prior to visit.    Review of Systems  Constitutional:  Negative for chills and fever.  Genitourinary:  Positive for dysuria. Negative for decreased urine volume, difficulty urinating, dyspareunia, flank pain, hematuria, pelvic pain, urgency, vaginal bleeding, vaginal discharge and vaginal pain.       Objective    BP 91/60   Pulse 96   Temp 98.2 F (36.8 C) (Oral)   Wt 110 lb 9.6 oz (50.2 kg)   LMP  (LMP Unknown)   SpO2 100%   BMI 20.90 kg/m    Physical Exam Vitals reviewed.  Constitutional:      General: She is awake.     Appearance: Normal appearance. She is well-developed and well-groomed.  HENT:     Head: Normocephalic and atraumatic.  Eyes:      General: Lids are normal. Gaze aligned appropriately.     Extraocular Movements: Extraocular movements intact.     Conjunctiva/sclera: Conjunctivae normal.  Pulmonary:     Effort: Pulmonary effort is normal.  Abdominal:     General: Abdomen is flat. Bowel sounds are normal.     Palpations: Abdomen is soft.     Tenderness: There is abdominal tenderness in the suprapubic area.     Comments: Mild suprapubic tenderness   Neurological:     Mental Status: She is alert.  Psychiatric:        Behavior: Behavior is cooperative.       Results for orders placed or performed in visit on 12/03/22  WET PREP FOR TRICH, YEAST, CLUE   Specimen: Sterile Swab   Sterile Swab  Result Value Ref Range   Trichomonas Exam Negative Negative   Yeast Exam Negative Negative   Clue Cell Exam Negative Negative  GC/Chlamydia Probe Amp   Specimen: Urine   UR  Result Value Ref Range   Chlamydia trachomatis, NAA Negative Negative   Neisseria Gonorrhoeae by PCR Negative Negative  Microscopic Examination   Urine  Result Value Ref Range   WBC, UA 11-30 (A) 0 - 5 /hpf   RBC, Urine 0-2 0 - 2 /hpf   Epithelial Cells (non renal) >10E 0 -  10 /hpf   Bacteria, UA Moderate (A) None seen/Few  Urinalysis, Routine w reflex microscopic  Result Value Ref Range   Specific Gravity, UA 1.025 1.005 - 1.030   pH, UA 5.5 5.0 - 7.5   Color, UA Yellow Yellow   Appearance Ur Cloudy (A) Clear   Leukocytes,UA 3+ (A) Negative   Protein,UA Negative Negative/Trace   Glucose, UA Negative Negative   Ketones, UA Negative Negative   RBC, UA Negative Negative   Bilirubin, UA Negative Negative   Urobilinogen, Ur 0.2 0.2 - 1.0 mg/dL   Nitrite, UA Negative Negative   Microscopic Examination See below:   RPR  Result Value Ref Range   RPR Ser Ql Non Reactive Non Reactive  HIV antibody (with reflex)  Result Value Ref Range   HIV Screen 4th Generation wRfx Non Reactive Non Reactive    Assessment & Plan      No follow-ups on  file.      Problem List Items Addressed This Visit   None Visit Diagnoses     Urinary tract infection without hematuria, site unspecified    -  Primary Acute, new concern Patient reports dysuria for the past 3 days  Reviewed UA results and there is suspicion for UTI with bacteria present and 3+ leukocytes  Patient appears to be approx [redacted] weeks pregnant so will use macrobid (per UTD recommendations) at this time for management  Recommend she stay well hydrated and avoid holding urine for prolonged periods Results of wet prep reviewed- no evidence of BV, trich or yeast  Follow up as needed for persistent or progressing symptoms     Relevant Medications   nitrofurantoin, macrocrystal-monohydrate, (MACROBID) 100 MG capsule   Other Relevant Orders   Urine Culture   Screening examination for STD (sexually transmitted disease)       Relevant Orders   WET PREP FOR TRICH, YEAST, CLUE (Completed)   Urinalysis, Routine w reflex microscopic (Completed)   GC/Chlamydia Probe Amp (Completed)   RPR (Completed)   HIV antibody (with reflex) (Completed)        No follow-ups on file.   I, Murray Guzzetta E Maesyn Frisinger, PA-C, have reviewed all documentation for this visit. The documentation on 12/07/22 for the exam, diagnosis, procedures, and orders are all accurate and complete.   Jacquelin Hawking, MHS, PA-C [facility]

## 2022-12-04 DIAGNOSIS — N390 Urinary tract infection, site not specified: Secondary | ICD-10-CM | POA: Diagnosis not present

## 2022-12-04 LAB — RPR: RPR Ser Ql: NONREACTIVE

## 2022-12-04 LAB — HIV ANTIBODY (ROUTINE TESTING W REFLEX): HIV Screen 4th Generation wRfx: NONREACTIVE

## 2022-12-07 LAB — GC/CHLAMYDIA PROBE AMP
Chlamydia trachomatis, NAA: NEGATIVE
Neisseria Gonorrhoeae by PCR: NEGATIVE

## 2022-12-09 LAB — URINE CULTURE

## 2023-01-25 ENCOUNTER — Ambulatory Visit: Payer: Self-pay

## 2023-01-25 NOTE — Telephone Encounter (Signed)
Reason for Disposition  MODERATE vaginal bleeding (e.g., soaking 1 pad or tampon per hour and present > 6 hours; 1 menstrual cup every 6 hours)  Answer Assessment - Initial Assessment Questions 1. AMOUNT: "Describe the bleeding that you are having."    - SPOTTING: spotting, or pinkish / brownish mucous discharge; does not fill panty liner or pad    - MILD:  less than 1 pad / hour; less than patient's usual menstrual bleeding   - MODERATE: 1-2 pads / hour; 1 menstrual cup every 6 hours; small-medium blood clots (e.g., pea, grape, small coin)   - SEVERE: soaking 2 or more pads/hour for 2 or more hours; 1 menstrual cup every 2 hours; bleeding not contained by pads or continuous red blood from vagina; large blood clots (e.g., golf ball, large coin)      Moderate 2. ONSET: "When did the bleeding begin?" "Is it continuing now?"     Friday night 3. MENSTRUAL PERIOD: "When was the last normal menstrual period?" "How is this different than your period?"     Started 5 days later - ended January 18 and restarted Friday night 4. REGULARITY: "How regular are your periods?"     no 5. ABDOMEN PAIN: "Do you have any pain?" "How bad is the pain?"  (e.g., Scale 1-10; mild, moderate, or severe)   - MILD (1-3): doesn't interfere with normal activities, abdomen soft and not tender to touch    - MODERATE (4-7): interferes with normal activities or awakens from sleep, abdomen tender to touch    - SEVERE (8-10): excruciating pain, doubled over, unable to do any normal activities      Left side - cramping 6. PREGNANCY: "Is there any chance you are pregnant?" "When was your last menstrual period?"     no 7. BREASTFEEDING: "Are you breastfeeding?"     *No Answer* 8. HORMONE MEDICINES: "Are you taking any hormone medicines, prescription or over-the-counter?" (e.g., birth control pills, estrogen)     *No Answer* 9. BLOOD THINNER MEDICINES: "Do you take any blood thinners?" (e.g., Coumadin / warfarin, Pradaxa /  dabigatran, aspirin)     *No Answer* 10. CAUSE: "What do you think is causing the bleeding?" (e.g., recent gyn surgery, recent gyn procedure; known bleeding disorder, cervical cancer, polycystic ovarian disease, fibroids)         *No Answer* 11. HEMODYNAMIC STATUS: "Are you weak or feeling lightheaded?" If Yes, ask: "Can you stand and walk normally?"        *No Answer* 12. OTHER SYMPTOMS: "What other symptoms are you having with the bleeding?" (e.g., passed tissue, vaginal discharge, fever, menstrual-type cramps)       *No Answer*  Protocols used: Vaginal Bleeding - Abnormal-A-AH

## 2023-01-25 NOTE — Telephone Encounter (Signed)
  Chief Complaint: Vaginal bleeding Symptoms: Recent miscarriage Frequency: January 18th Pertinent Negatives: Patient denies fever Disposition: [] ED /[] Urgent Care (no appt availability in office) / [x] Appointment(In office/virtual)/ []  [facility]/ [] Home Care/ [] Refused Recommended Disposition /[] [facility]/ []  Follow-up with PCP Additional Notes: PT states that she had a regular period. It was over and a new period began 5 days later. PT states she is using 1 pad per hour. She also had a fall recently and is concerned that the fall may have caused the bleeding.

## 2023-01-29 ENCOUNTER — Ambulatory Visit: Payer: Medicaid Other | Admitting: Family Medicine

## 2023-02-02 ENCOUNTER — Ambulatory Visit: Payer: Medicaid Other | Admitting: Family Medicine

## 2023-02-08 ENCOUNTER — Other Ambulatory Visit: Payer: Self-pay | Admitting: Obstetrics and Gynecology

## 2023-02-08 DIAGNOSIS — Z30011 Encounter for initial prescription of contraceptive pills: Secondary | ICD-10-CM

## 2023-03-03 ENCOUNTER — Ambulatory Visit: Payer: Self-pay

## 2023-03-03 NOTE — Telephone Encounter (Addendum)
     Chief Complaint: Dog bite to right thigh yesterday. Has 3 puncture wounds, bruised, red, painful. Random dog, does not know if rabies shots have been done. Symptoms: Pain Frequency: Yesterday Pertinent Negatives: Patient denies  Disposition: '[]'$ ED /'[]'$ Urgent Care (no appt availability in office) / '[x]'$ Appointment(In office/virtual)/ '[]'$  [facility]/ '[]'$ Home Care/ '[]'$ Refused Recommended Disposition /'[]'$ [place] [facility]/ '[]'$  Follow-up with PCP Additional Notes:  Iris in practice notified. Reason for Disposition  Looks infected (spreading redness, red streak, pus)  Answer Assessment - Initial Assessment Questions 1. ANIMAL: "What type of animal caused the bite?" "Is the injury from a bite or a claw?" If the animal is a dog or a cat, ask: "Was it a pet or a stray?" "Was it acting ill or behaving strangely?"     Dog  2. LOCATION: "Where is the bite located?"      Right leg - upper thigh 3. SIZE: "How big is the bite?" "What does it look like?"      3 punctures 4. ONSET: "When did the bite happen?" (Minutes or hours ago)      Yesterday 5. CIRCUMSTANCES: "Tell me how this happened."      Random dog 6. TETANUS: "When was your last tetanus booster?"     Unsure 7. RABIES VACCINE: For dog or cat bites, ask: "Do you know if the pet is vaccinated against rabies?"  (e.g., yes, no, overdue for rabies shot, unknown)     Unknown 8. PREGNANCY: "Is there any chance you are pregnant?" "When was your last menstrual period?"     No  Protocols used: Animal Bite-A-AH

## 2023-03-04 ENCOUNTER — Emergency Department
Admission: EM | Admit: 2023-03-04 | Discharge: 2023-03-04 | Disposition: A | Discharge status: Home / Self Care | Payer: Medicaid Other | Attending: Emergency Medicine | Admitting: Emergency Medicine

## 2023-03-04 ENCOUNTER — Encounter: Payer: Self-pay | Admitting: Physician Assistant

## 2023-03-04 ENCOUNTER — Ambulatory Visit: Payer: Medicaid Other | Admitting: Physician Assistant

## 2023-03-04 VITALS — BP 96/61 | HR 65 | Temp 98.6°F | Ht 61.0 in | Wt 110.8 lb

## 2023-03-04 DIAGNOSIS — S79921A Unspecified injury of right thigh, initial encounter: Secondary | ICD-10-CM | POA: Diagnosis present

## 2023-03-04 DIAGNOSIS — W540XXA Bitten by dog, initial encounter: Secondary | ICD-10-CM

## 2023-03-04 DIAGNOSIS — S71151A Open bite, right thigh, initial encounter: Secondary | ICD-10-CM

## 2023-03-04 DIAGNOSIS — Z23 Encounter for immunization: Secondary | ICD-10-CM

## 2023-03-04 DIAGNOSIS — S81851A Open bite, right lower leg, initial encounter: Secondary | ICD-10-CM | POA: Diagnosis not present

## 2023-03-04 LAB — CBC
HCT: 35.7 % — ABNORMAL LOW (ref 36.0–46.0)
Hemoglobin: 11.9 g/dL — ABNORMAL LOW (ref 12.0–15.0)
MCH: 28.7 pg (ref 26.0–34.0)
MCHC: 33.3 g/dL (ref 30.0–36.0)
MCV: 86.2 fL (ref 80.0–100.0)
Platelets: 403 10*3/uL — ABNORMAL HIGH (ref 150–400)
RBC: 4.14 MIL/uL (ref 3.87–5.11)
RDW: 13.2 % (ref 11.5–15.5)
WBC: 10.1 10*3/uL (ref 4.0–10.5)
nRBC: 0 % (ref 0.0–0.2)

## 2023-03-04 LAB — BASIC METABOLIC PANEL
Anion gap: 6 (ref 5–15)
BUN: 11 mg/dL (ref 6–20)
CO2: 24 mmol/L (ref 22–32)
Calcium: 9.3 mg/dL (ref 8.9–10.3)
Chloride: 105 mmol/L (ref 98–111)
Creatinine, Ser: 0.54 mg/dL (ref 0.44–1.00)
GFR, Estimated: >60 mL/min (ref >60)
Glucose, Bld: 95 mg/dL (ref 70–99)
Potassium: 3.7 mmol/L (ref 3.5–5.1)
Sodium: 135 mmol/L (ref 135–145)

## 2023-03-04 MED ORDER — METRONIDAZOLE 500 MG PO TABS: 500.0000 mg | ORAL_TABLET | Freq: Three times a day (TID) | ORAL | 0 refills | Status: AC

## 2023-03-04 MED ORDER — IBUPROFEN 600 MG PO TABS: 600.0000 mg | ORAL_TABLET | Freq: Four times a day (QID) | ORAL | 0 refills | Status: DC | PRN

## 2023-03-04 MED ORDER — DOXYCYCLINE HYCLATE 100 MG PO TABS: 100.0000 mg | ORAL_TABLET | Freq: Two times a day (BID) | ORAL | 0 refills | Status: DC

## 2023-03-04 NOTE — Patient Instructions (Signed)
Please take Tylenol and Ibuprofen as needed for pain. You can use warm compresses to the area as well to help with discomfort   Please go directly to the ED to discuss getting rabies vaccinations to prevent illness

## 2023-03-04 NOTE — Progress Notes (Signed)
Acute Office Visit   Patient: Carly Singleton   DOB: 03-18-1996   27 y.o. Female  MRN: [id number] Visit Date: 03/04/2023  Today's healthcare provider: Dani Gobble Ewelina Naves, PA-C  Introduced myself to the patient as a Journalist, newspaper and provided education on APPs in clinical practice.    Chief Complaint  Patient presents with   Animal Bite    Patient says on Monday and she was out and about and a random dog came up and bit her. Patient says she did not experience at the moment of the bite. Patient says yesterday she noticed bruising, pain and redness in the area.    Subjective    Animal Bite    HPI     Animal Bite    Additional comments: Patient says on Monday and she was out and about and a random dog came up and bit her. Patient says she did not experience at the moment of the bite. Patient says yesterday she noticed bruising, pain and redness in the area.       Last edited by Irena Reichmann, [place] on 03/04/2023  3:09 PM.        Dog Bite   States she was bitten by a dog on Monday afternoon  She is not sure if dog is vaccinated nor does she have contact with the owners She reports bite did break the skin  She has been taking Ibuprofen for pain and discomfort but the area is still sore and painful  She is not pregnant at this time- she states she had a miscarriage     Medications: Outpatient Medications Prior to Visit  Medication Sig   baclofen (LIORESAL) 10 MG tablet Take 10 mg by mouth 3 (three) times daily. (Patient not taking: Reported on 09/09/2022)   Norethindrone Acetate-Ethinyl Estrad-FE (LOESTRIN 24 FE) 1-20 MG-MCG(24) tablet Take 1 tablet by mouth daily. (Patient not taking: Reported on 09/09/2022)   ondansetron (ZOFRAN) 4 MG tablet Take 1 tablet (4 mg total) by mouth every 8 (eight) hours as needed for nausea or vomiting. (Patient not taking: Reported on 03/04/2023)   No facility-administered medications prior to visit.    Review of Systems  Skin:  Positive  for wound.       Objective    BP 96/61   Pulse 65   Temp 98.6 F (37 C) (Oral)   Ht 5\' 1"  (1.549 m)   Wt 110 lb 12.8 oz (50.3 kg)   LMP  (LMP Unknown)   SpO2 100%   Breastfeeding Unknown   BMI 20.94 kg/m    Physical Exam Vitals reviewed.  Constitutional:      General: She is awake.     Appearance: Normal appearance. She is well-developed and well-groomed.  HENT:     Head: Normocephalic and atraumatic.  Skin:    General: Skin is warm.     Capillary Refill: Capillary refill takes less than 2 seconds.     Findings: Bruising and wound present.  Neurological:     Mental Status: She is alert.  Psychiatric:        Behavior: Behavior is cooperative.      Media Information   Document Information  Photos    03/04/2023 15:46  Attached To:  Office Visit on 03/04/23 with Keesha Pellum, Dani Gobble, PA-C  Source Information  Danzel Marszalek, [place]  [facility]     Media Information   Document Information  Photos    03/04/2023  15:46  Attached To:  Office Visit on 03/04/23 with Lauren Modisette, Dani Gobble, PA-C  Source Information  Quatavious Rossa, [place]  [facility]     Media Information   Document Information  Photos    03/04/2023 15:46  Attached To:  Office Visit on 03/04/23 with Ellasyn Swilling, Dani Gobble, PA-C  Source Information  Lurine Imel, [place]  [facility]      Media Information   Document Information  Photos    03/04/2023 15:46  Attached To:  Office Visit on 03/04/23 with Koron Godeaux, Dani Gobble, PA-C  Source Information  Hennessy Bartel, Dani Gobble, PA-C  [facility]    No results found for any visits on 03/04/23.  Assessment & Plan      No follow-ups on file.      Problem List Items Addressed This Visit   None Visit Diagnoses     Dog bite of right thigh, initial encounter    -  Primary Acute, new concern Patient was bitten Monday by unknown dog with unknown rabies status Her most recent Tdap was in 2016 so will give booster today given  the fact that dog broke skin Area does not seem concerning for infection so will forgo abx at this time Concerned for rabies status and discussed this with patient Recommend she visit ED for rabies vaccination consultation to start vaccination series as indicated  ED was called and informed that patient would be en route for rabies vaccination  She can take Tylenol and Ibuprofen for pain management Reviewed ED and return precautions for signs of infection or other concerns  Follow up as needed for persistent or progressing symptoms    Relevant Orders   Tdap vaccine greater than or equal to 7yo IM        No follow-ups on file.   I, Rutledge Selsor E Ceri Mayer, PA-C, have reviewed all documentation for this visit. The documentation on 03/04/23 for the exam, diagnosis, procedures, and orders are all accurate and complete.   Talitha Givens, MHS, PA-C [facility]

## 2023-03-04 NOTE — ED Triage Notes (Signed)
Pt sts that she was bitten by a german shepard on Monday. Pt sts the bite did break the skin on the right thigh. Pt sts [place]

## 2023-03-04 NOTE — ED Notes (Signed)
RN called and notified [employer] about dog bite.

## 2023-03-04 NOTE — ED Provider Notes (Signed)
[facility] Provider Note    Event Date/Time   First MD Initiated Contact with Patient 03/04/23 1737     (approximate)   History   Animal Bite   HPI  Carly Singleton is a 27 y.o. female with no significant past medical history presents emergency department with a dog bite to the right thigh.  Patient states that on Monday she went to visit her friend that lives in a [place].  States when she arrived the dog approached her and was barking loudly.  States she tried to speak softly to the dog and when she lifted her leg to take a step he lunged at her and bit her thigh.  Patient states she got a Tdap today.     Physical Exam   Triage Vital Signs: ED Triage Vitals  Enc Vitals Group     BP 03/04/23 1637 122/72     Pulse Rate 03/04/23 1637 70     Resp 03/04/23 1637 17     Temp 03/04/23 1637 98.1 F (36.7 C)     Temp src --      SpO2 03/04/23 1637 97 %     Weight 03/04/23 1638 110 lb (49.9 kg)     Height --      Head Circumference --      Peak Flow --      Pain Score 03/04/23 1638 7     Pain Loc --      Pain Edu? --      Excl. in [place]? --     Most recent vital signs: Vitals:   03/04/23 1637  BP: 122/72  Pulse: 70  Resp: 17  Temp: 98.1 F (36.7 C)  SpO2: 97%     General: Awake, no distress.   CV:  Good peripheral perfusion. regular rate and  rhythm Resp:  Normal effort.  Abd:  No distention.   Other:  Right thigh with several puncture wounds and bruising noted on the right medial aspect.  No pus or drainage noted at this time.   ED Results / Procedures / Treatments   Labs (all labs ordered are listed, but only abnormal results are displayed) Labs Reviewed  CBC - Abnormal; Notable for the following components:      Result Value   Hemoglobin 11.9 (*)    HCT 35.7 (*)    Platelets 403 (*)    All other components within normal limits  BASIC METABOLIC PANEL      EKG     RADIOLOGY     PROCEDURES:   Procedures   MEDICATIONS ORDERED IN ED: Medications - No data to display   IMPRESSION / MDM / [employer] / ED COURSE  I reviewed the triage vital signs and the nursing notes.                              Differential diagnosis includes, but is not limited to, dog bite, cellulitis, wound infection  Patient's presentation is most consistent with acute complicated illness / injury requiring diagnostic workup.   Labs are reassuring  The patient's wound does not appear to be grossly infected, feel that we can do outpatient antibiotics as a preventative measure.  She is allergic to penicillin and azithromycin so we will start her on doxycycline in Flagyl.  She was given ibuprofen 600 mg 3 times daily for pain.  Follow-up with her regular doctor if not  improving to 3 days.  Return emergency department worsening.  Patient is in agreement with treatment plan.  She was discharged in stable condition.  Strict instructions to return the emergency department if animal control was unable to catch the dog or if the dog shows signs of rabies while in quarantine      FINAL CLINICAL IMPRESSION(S) / ED DIAGNOSES   Final diagnoses:  Dog bite, initial encounter     Rx / DC Orders   ED Discharge Orders          Ordered    doxycycline (VIBRA-TABS) 100 MG tablet  2 times daily        03/04/23 1802    metroNIDAZOLE (FLAGYL) 500 MG tablet  3 times daily        03/04/23 1802    ibuprofen (ADVIL) 600 MG tablet  Every 6 hours PRN        03/04/23 1803             Note:  This document was prepared using Dragon voice recognition software and may include unintentional dictation errors.    Versie Starks, PA-C 03/04/23 1818    Arta Silence, MD 03/04/23 2005

## 2023-03-04 NOTE — Discharge Instructions (Addendum)
Take the doxycycline and Flagyl as prescribed.  Since you are allergic to bacillin you will need to take 2 antibiotics.  Make sure that you have food on your stomach before taking these medications as it may make you nauseated if not.  Be sure to finish the medications.  They are for 7 days.  Apply a warm compress to the area.  Ibuprofen for pain as needed. If animal control is unable to catch the dog and observe it for 7 days please return emergency department for rabies vaccinations.

## 2023-03-04 NOTE — ED Notes (Signed)
Patient has large area of bruising to right thigh and what looks like 1 puncture wound with scab covering it.

## 2023-03-05 ENCOUNTER — Telehealth: Payer: Self-pay

## 2023-03-05 NOTE — Telephone Encounter (Signed)
Spoke with patient regarding getting a rabies vaccination at the ED, she stated that she was told to wait for 5 days and see if they catch the dog that bit her. Per Erin's recommendation I informed the patient that she would need to go to [facility] or another ED to get the rabies since it was already 5 days since she was bit and that it would be more prudent to get the vaccination now than wait another 5 days or if they catch the dog if then even by chance do catch it. Patient verbalized understanding.

## 2023-04-09 ENCOUNTER — Emergency Department (HOSPITAL_COMMUNITY)
Admission: EM | Admit: 2023-04-09 | Discharge: 2023-04-09 | Disposition: A | Discharge status: Home / Self Care | Payer: Medicaid Other | Attending: Emergency Medicine | Admitting: Emergency Medicine

## 2023-04-09 ENCOUNTER — Other Ambulatory Visit: Payer: Self-pay

## 2023-04-09 DIAGNOSIS — K029 Dental caries, unspecified: Secondary | ICD-10-CM | POA: Insufficient documentation

## 2023-04-09 DIAGNOSIS — K0889 Other specified disorders of teeth and supporting structures: Secondary | ICD-10-CM | POA: Diagnosis not present

## 2023-04-09 MED ORDER — CLINDAMYCIN HCL 150 MG PO CAPS
300.0000 mg | ORAL_CAPSULE | Freq: Once | ORAL | Status: AC
  Administered 2023-04-09: 300 mg via ORAL
  Filled 2023-04-09: qty 2

## 2023-04-09 MED ORDER — CLINDAMYCIN HCL 150 MG PO CAPS: 300.0000 mg | ORAL_CAPSULE | Freq: Four times a day (QID) | ORAL | 0 refills | Status: DC

## 2023-04-09 MED ORDER — IBUPROFEN 400 MG PO TABS
600.0000 mg | ORAL_TABLET | Freq: Once | ORAL | Status: AC
  Administered 2023-04-09: 600 mg via ORAL
  Filled 2023-04-09: qty 1

## 2023-04-09 NOTE — ED Triage Notes (Signed)
Pt came ot ED for toothache for a week.  Pt took tylenol and motrin and states it worked for a little bit. Pt denies fevers.

## 2023-04-09 NOTE — ED Provider Notes (Addendum)
[place] EMERGENCY DEPARTMENT AT [facility] Provider Note   CSN: [id number] Arrival date & time: 04/09/23  0900     History  Chief Complaint  Patient presents with   Dental Pain   HPI Carly Singleton is a 27 y.o. female presenting for dental pain.  Started 3 days ago.  Pain is located in the back lower right molar.  Patient states that pain is gotten increasingly worse which prompted her to be evaluated today.  Also noticed some swelling in her right cheek.  Denies fever and chills.  Denies trouble swallowing or breathing.  Denies any other swelling in her mouth. Has been taking ibuprofen and Tylenol for pain.  E HPI    Dental Pain      Home Medications Prior to Admission medications   Medication Sig Start Date End Date Taking? Authorizing Provider  clindamycin (CLEOCIN) 150 MG capsule Take 2 capsules (300 mg total) by mouth every 6 (six) hours. 04/09/23  Yes Gareth Eagle, PA-C  baclofen (LIORESAL) 10 MG tablet Take 10 mg by mouth 3 (three) times daily. Patient not taking: Reported on 09/09/2022    [provider]  doxycycline (VIBRA-TABS) 100 MG tablet Take 1 tablet (100 mg total) by mouth 2 (two) times daily. 03/04/23   Fisher, Roselyn Bering, PA-C  ibuprofen (ADVIL) 600 MG tablet Take 1 tablet (600 mg total) by mouth every 6 (six) hours as needed. 03/04/23   Fisher, Roselyn Bering, PA-C  Norethindrone Acetate-Ethinyl Estrad-FE (LOESTRIN 24 FE) 1-20 MG-MCG(24) tablet Take 1 tablet by mouth daily. Patient not taking: Reported on 09/09/2022 12/03/21   Hildred Laser, MD  ondansetron (ZOFRAN) 4 MG tablet Take 1 tablet (4 mg total) by mouth every 8 (eight) hours as needed for nausea or vomiting. Patient not taking: Reported on 03/04/2023 09/30/22   Tresea Mall, CNM      Allergies    Azithromycin and Penicillin g    Review of Systems   See HPI for pertinent positives  Physical Exam Updated Vital Signs BP (!) 97/56 (BP Location: Right Arm)   Pulse 81   Temp 98 F  (36.7 C) (Oral)   Resp 16   Ht 5\' 1"  (1.549 m)   Wt 52.2 kg   LMP  (LMP Unknown)   SpO2 98%   BMI 21.73 kg/m  Physical Exam Constitutional:      Appearance: Normal appearance.  HENT:     Head: Normocephalic.     Comments: Swelling noted in the right cheek    Nose: Nose normal.     Mouth/Throat:     Dentition: Dental caries present.     Pharynx: Oropharynx is clear. Uvula midline. No pharyngeal swelling or oropharyngeal exudate.     Tonsils: No tonsillar exudate or tonsillar abscesses.   Eyes:     Conjunctiva/sclera: Conjunctivae normal.  Pulmonary:     Effort: Pulmonary effort is normal.  Lymphadenopathy:     Cervical: No cervical adenopathy.  Neurological:     Mental Status: She is alert.  Psychiatric:        Mood and Affect: Mood normal.     ED Results / Procedures / Treatments   Labs (all labs ordered are listed, but only abnormal results are displayed) Labs Reviewed - No data to display  EKG None  Radiology No results found.  Procedures Procedures    Medications Ordered in ED Medications  ibuprofen (ADVIL) tablet 600 mg (600 mg Oral Given 04/09/23 1042)  clindamycin (CLEOCIN) capsule 300  mg (300 mg Oral Given 04/09/23 1054)    ED Course/ Medical Decision Making/ A&P                             Medical Decision Making Risk Prescription drug management.   27 year old well-appearing female presenting for dental pain.  Exam remarkable for gingival swelling and swelling of the right cheek.  Symptoms and exam findings concerning for dental infection.  Started patient on clindamycin.  Patient has documented allergy to penicillins. Treat her pain with ibuprofen.  Advised her to follow-up with her dentist.  Doubt liquids angina and retropharyngeal abscess.  Discussed return precautions.  Vital stable at discharge.        Final Clinical Impression(s) / ED Diagnoses Final diagnoses:  Pain, dental    Rx / DC Orders ED Discharge Orders           Ordered    clindamycin (CLEOCIN) 150 MG capsule  Every 6 hours        04/09/23 1055              Gareth Eagle, PA-C 04/09/23 1057    Gareth Eagle, PA-C 04/09/23 1058    Terald Sleeper, MD 04/09/23 1200

## 2023-04-09 NOTE — ED Notes (Signed)
AVS reviewed with pt prior to discharge. Pt verbalizes understanding. Belongings with pt upon depart. Ambulatory to POV by self. 

## 2023-04-09 NOTE — Discharge Instructions (Addendum)
Evaluation for dental pain revealed that you likely do have an infection.  I am starting you on clindamycin.  Please take the entire course and follow-up with your dentist.  Advised if possible to reschedule appointment for earlier date.  If you have new trouble swallowing, worsening swelling, trouble breathing or any Other concern please return emergency department further evaluation.

## 2023-06-02 ENCOUNTER — Telehealth: Payer: Self-pay | Admitting: Family Medicine

## 2023-06-02 NOTE — Telephone Encounter (Unsigned)
Copied from CRM [id number]. Topic: General - Other >> Jun 02, 2023  1:00 PM Dominique A wrote: Reason for CRM: Pt is calling to see if she can get a pregnancy test done at the lab. Please advise.

## 2023-06-02 NOTE — Telephone Encounter (Signed)
Appointment has been made

## 2023-06-02 NOTE — Telephone Encounter (Signed)
Patient would need an appointment with a provider to have this ordered.

## 2023-06-15 ENCOUNTER — Ambulatory Visit: Payer: Medicaid Other | Admitting: Family Medicine

## 2023-08-18 DIAGNOSIS — Z3201 Encounter for pregnancy test, result positive: Secondary | ICD-10-CM | POA: Diagnosis not present

## 2023-08-27 ENCOUNTER — Inpatient Hospital Stay (HOSPITAL_COMMUNITY): Payer: Medicaid Other

## 2023-08-27 ENCOUNTER — Inpatient Hospital Stay (EMERGENCY_DEPARTMENT_HOSPITAL)
Admission: AD | Admit: 2023-08-27 | Discharge: 2023-08-27 | Disposition: A | Discharge status: Home / Self Care | Payer: Medicaid Other | Source: Home / Self Care | Attending: Obstetrics and Gynecology | Admitting: Obstetrics and Gynecology

## 2023-08-27 ENCOUNTER — Encounter (HOSPITAL_COMMUNITY): Payer: Self-pay | Admitting: *Deleted

## 2023-08-27 ENCOUNTER — Inpatient Hospital Stay (HOSPITAL_COMMUNITY)
Admission: AD | Admit: 2023-08-27 | Discharge: 2023-08-27 | Discharge status: Against Medical Advice | Payer: Medicaid Other | Source: Home / Self Care | Attending: Obstetrics and Gynecology | Admitting: Obstetrics and Gynecology

## 2023-08-27 DIAGNOSIS — Z3A00 Weeks of gestation of pregnancy not specified: Secondary | ICD-10-CM | POA: Diagnosis not present

## 2023-08-27 DIAGNOSIS — Z5329 Procedure and treatment not carried out because of patient's decision for other reasons: Secondary | ICD-10-CM | POA: Insufficient documentation

## 2023-08-27 DIAGNOSIS — O26891 Other specified pregnancy related conditions, first trimester: Secondary | ICD-10-CM

## 2023-08-27 DIAGNOSIS — Z3A08 8 weeks gestation of pregnancy: Secondary | ICD-10-CM

## 2023-08-27 DIAGNOSIS — R109 Unspecified abdominal pain: Secondary | ICD-10-CM

## 2023-08-27 DIAGNOSIS — O26851 Spotting complicating pregnancy, first trimester: Secondary | ICD-10-CM

## 2023-08-27 DIAGNOSIS — O3680X0 Pregnancy with inconclusive fetal viability, not applicable or unspecified: Secondary | ICD-10-CM

## 2023-08-27 DIAGNOSIS — O26899 Other specified pregnancy related conditions, unspecified trimester: Secondary | ICD-10-CM

## 2023-08-27 LAB — URINALYSIS, ROUTINE W REFLEX MICROSCOPIC
Bilirubin Urine: NEGATIVE
Glucose, UA: NEGATIVE mg/dL
Hgb urine dipstick: NEGATIVE
Ketones, ur: 5 mg/dL — AB
Nitrite: NEGATIVE
Protein, ur: NEGATIVE mg/dL
Specific Gravity, Urine: 1.03 (ref 1.005–1.030)
pH: 5 (ref 5.0–8.0)

## 2023-08-27 LAB — WET PREP, GENITAL
Clue Cells Wet Prep HPF POC: NONE SEEN
Sperm: NONE SEEN
Trich, Wet Prep: NONE SEEN
WBC, Wet Prep HPF POC: >=10 — AB (ref <10)
Yeast Wet Prep HPF POC: NONE SEEN

## 2023-08-27 LAB — POCT PREGNANCY, URINE: Preg Test, Ur: POSITIVE — AB

## 2023-08-27 LAB — CBC
HCT: 34.8 % — ABNORMAL LOW (ref 36.0–46.0)
Hemoglobin: 11.9 g/dL — ABNORMAL LOW (ref 12.0–15.0)
MCH: 28.8 pg (ref 26.0–34.0)
MCHC: 34.2 g/dL (ref 30.0–36.0)
MCV: 84.3 fL (ref 80.0–100.0)
Platelets: 432 10*3/uL — ABNORMAL HIGH (ref 150–400)
RBC: 4.13 MIL/uL (ref 3.87–5.11)
RDW: 12.8 % (ref 11.5–15.5)
WBC: 10.5 10*3/uL (ref 4.0–10.5)
nRBC: 0 % (ref 0.0–0.2)

## 2023-08-27 LAB — HCG, QUANTITATIVE, PREGNANCY: hCG, Beta Chain, Quant, S: 16982 m[IU]/mL — ABNORMAL HIGH (ref <5)

## 2023-08-27 MED ORDER — ONDANSETRON 4 MG PO TBDP
8.0000 mg | ORAL_TABLET | Freq: Once | ORAL | Status: AC
  Administered 2023-08-27: 8 mg via ORAL
  Filled 2023-08-27: qty 2

## 2023-08-27 MED ORDER — EXCEDRIN TENSION HEADACHE 500-65 MG PO TABS: 2.0000 | ORAL_TABLET | Freq: Three times a day (TID) | ORAL | 0 refills | Status: DC | PRN

## 2023-08-27 MED ORDER — ONDANSETRON 8 MG PO TBDP: 8.0000 mg | ORAL_TABLET | Freq: Three times a day (TID) | ORAL | 0 refills | Status: DC | PRN

## 2023-08-27 MED ORDER — ACETAMINOPHEN-CAFFEINE 500-65 MG PO TABS
2.0000 | ORAL_TABLET | Freq: Once | ORAL | Status: AC
  Administered 2023-08-27: 2 via ORAL
  Filled 2023-08-27: qty 2

## 2023-08-27 NOTE — MAU Note (Signed)
Carly Singleton is a 27 y.o. at Unknown here in MAU reporting: feeling a lot of pressure.in lower abd rt and left quad, feels like period pain.  Has been having some pain and pressure. Some spotting last night. Hx of early miscarriage, worried happening again. LMP: 7/4, confirmed at UC 3 wks ago in [place] Onset of complaint: yesterday Pain score: 5 Vitals:   08/27/23 1322  BP: (!) 105/56  Pulse: 73  Resp: 16  Temp: 98.2 F (36.8 C)  SpO2: 100%      Lab orders placed from triage:  UPT/UA

## 2023-08-27 NOTE — Discharge Instructions (Signed)
Safe Medications in Pregnancy   Acne: Benzoyl Peroxide Salicylic Acid  Backache/Headache: Tylenol: 2 regular strength every 4 hours OR              2 Extra strength every 6 hours  Colds/Coughs/Allergies: Benadryl (alcohol free) 25 mg every 6 hours as needed Breath right strips Claritin Cepacol throat lozenges Chloraseptic throat spray Cold-Eeze- up to three times per day Cough drops, alcohol free Flonase (by prescription only) Guaifenesin Mucinex Robitussin DM (plain only, alcohol free) Saline nasal spray/drops Sudafed (pseudoephedrine) & Actifed ** use only after [redacted] weeks gestation and if you do not have high blood pressure Tylenol Vicks Vaporub Zinc lozenges Zyrtec   Constipation: Colace Ducolax suppositories Fleet enema Glycerin suppositories Metamucil Milk of magnesia Miralax Senokot Smooth move tea  Diarrhea: Kaopectate Imodium A-D  *NO pepto Bismol  Hemorrhoids: Anusol Anusol HC Preparation H Tucks  Indigestion: Tums Maalox Mylanta Zantac  Pepcid  Insomnia: Benadryl (alcohol free) 25mg  every 6 hours as needed Tylenol PM Unisom, no Gelcaps  Leg Cramps: Tums MagGel  Nausea/Vomiting:  Bonine Dramamine Emetrol Ginger extract Sea bands Meclizine  Nausea medication to take during pregnancy:  Unisom (doxylamine succinate 25 mg tablets) Take one tablet daily at bedtime. If symptoms are not adequately controlled, the dose can be increased to a maximum recommended dose of two tablets daily (1/2 tablet in the morning, 1/2 tablet mid-afternoon and one at bedtime). Vitamin B6 100mg  tablets. Take one tablet twice a day (up to 200 mg per day).  Skin Rashes: Aveeno products Benadryl cream or 25mg  every 6 hours as needed Calamine Lotion 1% cortisone cream  Yeast infection: Gyne-lotrimin 7 Monistat 7   **If taking multiple medications, please check labels to avoid duplicating the same active ingredients **take medication as directed on  the label ** Do not exceed 4000 mg of tylenol in 24 hours **Do not take medications that contain aspirin or ibuprofen   [employer] for [employer] at [employer] [facility]             [address] [place], [place] [address] [phone]  Center for [employer] at [facility]                                                             [address], [place], [place], [address] [phone]  Center for [facility] at [facility] [address], [address], [place], [place], [address] [phone]  Center for [facility] at [facility] [address], [address], [place], [place], [address] [phone]  Center for [facility] at [facility]                                 [address] [place], [place], [place], [address] [phone]  Center for [facility] at [facility]  [address] [place], [place], [address] [phone]  Center for [facility] at [facility] [address], [place], [place], [address]                              [facility] [address], [place], [place], [address] [phone]  [facility]         Phone: [phone]  [facility]      Phone: [phone]   [facility]      Phone: [phone]  [facility]-Family Planning         Phone: [phone]   [facility] Department-Maternity    Phone: [phone]  [facility]      Phone: [phone]  [facility]     Phone: [phone]

## 2023-08-27 NOTE — MAU Provider Note (Cosign Needed Addendum)
History     CSN: [id number]  Arrival date and time: 08/27/23 1657     Chief Complaint  Patient presents with   Abdominal Pain   Nausea   HPI [name] is a 27 y.o. year old [phone] female at [redacted]w[redacted]d weeks gestation who presents back to MAU after picking son up from school reporting lower abdominal pressure x 1 week and spotting last night; last SI was 2 days ago. She had pregnancy confirmed at Urgent Care in [place] 3 weeks ago. She tried to establish [facility] at a [facility] provider, but plans to deliver at [facility].   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   Supervision of other normal pregnancy, antepartum 09/09/2022    Past Surgical History:  Procedure Laterality Date   WISDOM TOOTH EXTRACTION Left 06/2022   two;    Family History  Problem Relation Age of Onset   Healthy Mother    Healthy Father    Cancer Maternal Grandmother 57       stomach   Cancer Maternal Grandfather 40       stomach   Healthy Paternal Grandmother    Other Paternal Grandfather        liver failure   Down syndrome Daughter    Healthy Son    Diabetes Neg Hx    Heart disease Neg Hx     Social History   Tobacco Use   Smoking status: Never   Smokeless tobacco: Never  Vaping Use   Vaping status: Never Used  Substance Use Topics   Alcohol use: Not Currently    Comment: monthly   Drug use: Not Currently    Types: Marijuana    Comment: last 2021    Allergies:  Allergies  Allergen Reactions   Azithromycin Itching and Rash    Only seen with IV usage not po medication.   Penicillin G Rash    Medications Prior to Admission  Medication Sig Dispense Refill  Last Dose   baclofen (LIORESAL) 10 MG tablet Take 10 mg by mouth 3 (three) times daily. (Patient not taking: Reported on 09/09/2022)      clindamycin (CLEOCIN) 150 MG capsule Take 2 capsules (300 mg total) by mouth every 6 (six) hours. 28 capsule 0    doxycycline (VIBRA-TABS) 100 MG tablet Take 1 tablet (100 mg total) by mouth 2 (two) times daily. 14 tablet 0    ibuprofen (ADVIL) 600 MG tablet Take 1 tablet (600 mg total) by mouth every 6 (six) hours as needed. 30 tablet 0    Norethindrone Acetate-Ethinyl Estrad-FE (LOESTRIN 24 FE) 1-20 MG-MCG(24) tablet Take 1 tablet by mouth daily. (Patient not taking: Reported on 09/09/2022) 84 tablet 3    ondansetron (ZOFRAN) 4 MG tablet Take 1 tablet (4 mg total) by mouth every 8 (eight) hours as needed for nausea or vomiting. (Patient not taking: Reported on 03/04/2023) 20 tablet 0  Review of Systems  Constitutional: Negative.   HENT: Negative.    Eyes: Negative.   Respiratory: Negative.    Cardiovascular: Negative.   Gastrointestinal:  Positive for nausea.  Endocrine: Negative.   Genitourinary:  Positive for pelvic pain and vaginal bleeding (spotting last night).  Musculoskeletal: Negative.   Skin: Negative.   Allergic/Immunologic: Negative.   Neurological:  Positive for headaches.  Hematological: Negative.   Psychiatric/Behavioral: Negative.     Physical Exam   Blood pressure 104/73, pulse 64, temperature 97.8 F (36.6 C), temperature source Oral, resp. rate 16, last menstrual period 07/01/2023, SpO2 100%, unknown if currently breastfeeding.  Physical Exam Vitals and nursing note reviewed.  Constitutional:      Appearance: Normal appearance. She is normal weight.  Cardiovascular:     Rate and Rhythm: Normal rate.  Pulmonary:     Effort: Pulmonary effort is normal.  Abdominal:     General: Abdomen is flat.     Palpations: Abdomen is soft.  Genitourinary:    Comments:  -- done at earlier visit Musculoskeletal:        General: Normal  range of motion.  Skin:    General: Skin is warm and dry.  Neurological:     Mental Status: She is alert and oriented to person, place, and time.  Psychiatric:        Mood and Affect: Mood normal.        Behavior: Behavior normal.        Thought Content: Thought content normal.        Judgment: Judgment normal.    MAU Course  Procedures  MDM CCUA -- done at earlier visit UPT -- done at earlier visit CBC -- done at earlier visit HCG -- done at earlier visit Wet Prep -- done at earlier visit GC/CT -- pending HIV -- pending OB < 14 wks [place] with TV Excedrin Tension Headache 2 tablets po --> no need to wait   Results for orders placed or performed during the hospital encounter of 08/27/23 (from the past 24 hour(s))  Pregnancy, urine POC     Status: Abnormal   Collection Time: 08/27/23  1:39 PM  Result Value Ref Range   Preg Test, Ur POSITIVE (A) NEGATIVE  Urinalysis, Routine w reflex microscopic -Urine, Clean Catch     Status: Abnormal   Collection Time: 08/27/23  1:42 PM  Result Value Ref Range   Color, Urine AMBER (A) YELLOW   APPearance HAZY (A) CLEAR   Specific Gravity, Urine 1.030 1.005 - 1.030   pH 5.0 5.0 - 8.0   Glucose, UA NEGATIVE NEGATIVE mg/dL   Hgb urine dipstick NEGATIVE NEGATIVE   Bilirubin Urine NEGATIVE NEGATIVE   Ketones, ur 5 (A) NEGATIVE mg/dL   Protein, ur NEGATIVE NEGATIVE mg/dL   Nitrite NEGATIVE NEGATIVE   Leukocytes,Ua MODERATE (A) NEGATIVE   RBC / HPF 0-5 0 - 5 RBC/hpf   WBC, UA 6-10 0 - 5 WBC/hpf   Bacteria, UA RARE (A) NONE SEEN   Squamous Epithelial / HPF 6-10 0 - 5 /HPF   Mucus PRESENT    Ca Oxalate Crys, UA PRESENT   Wet prep, genital     Status: Abnormal   Collection Time: 08/27/23  1:42 PM   Specimen: PATH Cytology Cervicovaginal Ancillary Only  Result Value Ref Range   Yeast Wet Prep HPF POC NONE SEEN NONE SEEN   Trich, Wet Prep NONE SEEN NONE SEEN   Clue Cells Wet Prep HPF POC NONE SEEN NONE  SEEN   WBC, Wet Prep HPF POC >=10 (A)  <10   Sperm NONE SEEN   CBC     Status: Abnormal   Collection Time: 08/27/23  2:19 PM  Result Value Ref Range   WBC 10.5 4.0 - 10.5 K/uL   RBC 4.13 3.87 - 5.11 MIL/uL   Hemoglobin 11.9 (L) 12.0 - 15.0 g/dL   HCT 25.3 (L) 66.4 - 40.3 %   MCV 84.3 80.0 - 100.0 fL   MCH 28.8 26.0 - 34.0 pg   MCHC 34.2 30.0 - 36.0 g/dL   RDW 47.4 25.9 - 56.3 %   Platelets 432 (H) 150 - 400 K/uL   nRBC 0.0 0.0 - 0.2 %  hCG, quantitative, pregnancy     Status: Abnormal   Collection Time: 08/27/23  2:19 PM  Result Value Ref Range   hCG, Beta Chain, Quant, S 16,982 (H) <5 mIU/mL   US OB <14 WEEK WITH OB TRANSVAGINAL CLINICAL DATA: Abdominal pain. Estimated gestational age by last menstrual period equals eight weeks 1 day EXAM: OBSTETRIC <14 WK [place] AND TRANSVAGINAL OB US TECHNIQUE: Both transabdominal and transvaginal ultrasound examinations were performed for complete evaluation of the gestation as well as the maternal uterus, adnexal regions, and pelvic cul-de-sac. Transvaginal technique was performed to assess early pregnancy. COMPARISON: None Available. FINDINGS: Intrauterine gestational sac: Single Yolk sac: Present Embryo: Not identified Cardiac Activity: not identified MSD: 10.2 mm 5 w 5 d Subchorionic hemorrhage: None visualized. Maternal uterus/adnexae: Normal uterus and ovaries. No free fluid IMPRESSION: Early intrauterine gestational sac with yolk sac but NO fetal pole, or cardiac activity yet visualized. Recommend follow-up quantitative B-HCG levels and follow-up US in 14 days to assess viability. This recommendation follows SRU consensus guidelines: Diagnostic Criteria for Nonviable Pregnancy Early in the First Trimester. Malva Limes Med 2013; 875:6433-29. Electronically Signed By: Genevive Bi M.D 08/27/2023 18:42   Assessment and Plan  1. Spotting and cramping affecting pregnancy, antepartum - Discussed normalcy of spotting after SI  - Information provided on abdominal pain in pregnancy    2.  Pregnancy with uncertain fetal viability, single or unspecified fetus - Viability U/S scheduled for 2 weeks  3. [redacted] weeks gestation of pregnancy - Information provided on morning sickness, safe medications in pregnancy and list of OB providers that take Medicaid - Advised it is best to establish care with an OB provider that has privileges through [facility], so her records will already be here and complete when she comes to our facility to seek care - Rx: Zofran 8 mg ODT every 8 hrs prn N/V    - Discharge patient - Keep scheduled U/S appt at [facility] on 09/09/2023 @ 1045 - Patient verbalized an understanding of the plan of care and agrees.   Raelyn Mora, CNM 08/27/2023, 8:23 PM

## 2023-08-27 NOTE — MAU Note (Signed)
Carly Singleton is a 27 y.o. at [redacted]w[redacted]d here in MAU reporting: still feeling cramping and pressure.  No spotting since last night.  Feeling very nauseated.  Onset of complaint: ongoing Pain score: 5 Vitals:   08/27/23 1729  BP: 104/73  Pulse: 64  Resp: 16  Temp: 97.8 F (36.6 C)  SpO2: 100%      Lab orders placed from triage:    Blood work was drawn when here earlier

## 2023-08-27 NOTE — MAU Provider Note (Signed)
History     CSN: [id number]  Arrival date and time: 08/27/23 1309   Event Date/Time   First Provider Initiated Contact with Patient 08/27/23 1350      Chief Complaint  Patient presents with   Abdominal Pain   Possible Pregnancy   HPI [name] is a 27 y.o. year old G6P2032 female at [redacted]w[redacted]d weeks gestation by her LMP on 07/01/2023 who presents to MAU reporting feeling a lot of pressure in her lower abdomen on both sides; "like period pain." She reports she has been having pain and pressure for a while. She also reports some spotting last night. She did have her pregnancy confirmed at an Urgent Care in [place] 3 weeks ago.  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   Supervision of other normal pregnancy, antepartum 09/09/2022    Past Surgical History:  Procedure Laterality Date   WISDOM TOOTH EXTRACTION Left 06/2022   two;    Family History  Problem Relation Age of Onset   Healthy Mother    Healthy Father    Cancer Maternal Grandmother 34       stomach   Cancer Maternal Grandfather 21       stomach   Healthy Paternal Grandmother    Other Paternal Grandfather        liver failure   Down syndrome Daughter    Healthy Son    Diabetes Neg Hx    Heart disease Neg Hx     Social History   Tobacco Use   Smoking status: Never   Smokeless tobacco: Never  Vaping Use   Vaping status: Never Used  Substance Use Topics   Alcohol use: Not Currently    Comment: monthly   Drug use: Not Currently    Types: Marijuana    Comment: last 2021    Allergies:  Allergies  Allergen Reactions   Azithromycin Itching and Rash    Only seen with IV usage not po medication.    Penicillin G Rash    Medications Prior to Admission  Medication Sig Dispense Refill Last Dose   baclofen (LIORESAL) 10 MG tablet Take 10 mg by mouth 3 (three) times daily. (Patient not taking: Reported on 09/09/2022)      clindamycin (CLEOCIN) 150 MG capsule Take 2 capsules (300 mg total) by mouth every 6 (six) hours. 28 capsule 0    doxycycline (VIBRA-TABS) 100 MG tablet Take 1 tablet (100 mg total) by mouth 2 (two) times daily. 14 tablet 0    ibuprofen (ADVIL) 600 MG tablet Take 1 tablet (600 mg total) by mouth every 6 (six) hours as needed. 30 tablet 0    Norethindrone Acetate-Ethinyl Estrad-FE (LOESTRIN 24 FE) 1-20 MG-MCG(24) tablet Take 1 tablet by mouth daily. (Patient not taking: Reported on 09/09/2022) 84 tablet 3    ondansetron (ZOFRAN) 4 MG tablet Take 1 tablet (4 mg total) by mouth every 8 (eight) hours as needed for  nausea or vomiting. (Patient not taking: Reported on 03/04/2023) 20 tablet 0     Review of Systems  Constitutional: Negative.   HENT: Negative.    Eyes: Negative.   Respiratory: Negative.    Cardiovascular: Negative.   Gastrointestinal: Negative.   Endocrine: Negative.   Genitourinary:  Positive for pelvic pain ("like a period"; lots of pressure).  Musculoskeletal: Negative.   Skin: Negative.   Allergic/Immunologic: Negative.   Neurological: Negative.   Hematological: Negative.   Psychiatric/Behavioral: Negative.     Physical Exam   Blood pressure (!) 105/56, pulse 73, temperature 98.2 F (36.8 C), temperature source Oral, resp. rate 16, height 5\' 1"  (1.549 m), weight 48 kg, last menstrual period 07/01/2023, SpO2 100%, unknown if currently breastfeeding.  Physical Exam Cardiovascular:     Rate and Rhythm: Normal rate.  Pulmonary:     Effort: Pulmonary effort is normal.  Abdominal:     General: Abdomen is flat.     Palpations: Abdomen is soft.  Genitourinary:    Comments: Swabs collected by patient using blind swab technique  Musculoskeletal:         General: Normal range of motion.  Skin:    General: Skin is warm and dry.  Neurological:     Mental Status: She is oriented to person, place, and time.  Psychiatric:        Mood and Affect: Mood normal.        Behavior: Behavior normal.        Thought Content: Thought content normal.        Judgment: Judgment normal.     MAU Course  Procedures  MDM CCUA UPT CBC ABO/Rh -- not drawn; known O Pos HCG Wet Prep GC/CT -- pending HIV -- pending OB < 14 wks [place] with TV-- not done  Results for orders placed or performed during the hospital encounter of 08/27/23 (from the past 24 hour(s))  Pregnancy, urine POC     Status: Abnormal   Collection Time: 08/27/23  1:39 PM  Result Value Ref Range   Preg Test, Ur POSITIVE (A) NEGATIVE  Urinalysis, Routine w reflex microscopic -Urine, Clean Catch     Status: Abnormal   Collection Time: 08/27/23  1:42 PM  Result Value Ref Range   Color, Urine AMBER (A) YELLOW   APPearance HAZY (A) CLEAR   Specific Gravity, Urine 1.030 1.005 - 1.030   pH 5.0 5.0 - 8.0   Glucose, UA NEGATIVE NEGATIVE mg/dL   Hgb urine dipstick NEGATIVE NEGATIVE   Bilirubin Urine NEGATIVE NEGATIVE   Ketones, ur 5 (A) NEGATIVE mg/dL   Protein, ur NEGATIVE NEGATIVE mg/dL   Nitrite NEGATIVE NEGATIVE   Leukocytes,Ua MODERATE (A) NEGATIVE   RBC / HPF 0-5 0 - 5 RBC/hpf   WBC, UA 6-10 0 - 5 WBC/hpf   Bacteria, UA RARE (A) NONE SEEN   Squamous Epithelial / HPF 6-10 0 - 5 /HPF   Mucus PRESENT    Ca Oxalate Crys, UA PRESENT   Wet prep, genital     Status: Abnormal   Collection Time: 08/27/23  1:42 PM   Specimen: PATH Cytology Cervicovaginal Ancillary Only  Result Value Ref Range   Yeast Wet Prep HPF POC NONE SEEN NONE SEEN   Trich, Wet Prep NONE SEEN NONE SEEN   Clue Cells Wet Prep HPF POC NONE SEEN NONE SEEN   WBC, Wet Prep HPF POC >=10 (A) <10   Sperm NONE SEEN      Assessment and Plan  Patient left AMA @ 1532 stating she need to go pick her kids up from school and  she would return later today.  Raelyn Mora, CNM 08/27/2023, 1:50 PM

## 2023-09-01 ENCOUNTER — Other Ambulatory Visit: Payer: Self-pay

## 2023-09-01 ENCOUNTER — Emergency Department
Admission: EM | Admit: 2023-09-01 | Discharge: 2023-09-01 | Discharge status: Against Medical Advice | Payer: Medicaid Other | Attending: Emergency Medicine | Admitting: Emergency Medicine

## 2023-09-01 ENCOUNTER — Telehealth: Payer: Medicaid Other | Admitting: Physician Assistant

## 2023-09-01 ENCOUNTER — Encounter: Payer: Self-pay | Admitting: Emergency Medicine

## 2023-09-01 DIAGNOSIS — O219 Vomiting of pregnancy, unspecified: Secondary | ICD-10-CM | POA: Insufficient documentation

## 2023-09-01 DIAGNOSIS — R630 Anorexia: Secondary | ICD-10-CM | POA: Diagnosis not present

## 2023-09-01 DIAGNOSIS — R42 Dizziness and giddiness: Secondary | ICD-10-CM

## 2023-09-01 DIAGNOSIS — Z3A01 Less than 8 weeks gestation of pregnancy: Secondary | ICD-10-CM | POA: Diagnosis not present

## 2023-09-01 DIAGNOSIS — Z5321 Procedure and treatment not carried out due to patient leaving prior to being seen by health care provider: Secondary | ICD-10-CM | POA: Insufficient documentation

## 2023-09-01 DIAGNOSIS — R1110 Vomiting, unspecified: Secondary | ICD-10-CM

## 2023-09-01 DIAGNOSIS — Z3A08 8 weeks gestation of pregnancy: Secondary | ICD-10-CM

## 2023-09-01 LAB — URINALYSIS, ROUTINE W REFLEX MICROSCOPIC
Bilirubin Urine: NEGATIVE
Glucose, UA: NEGATIVE mg/dL
Ketones, ur: 20 mg/dL — AB
Nitrite: NEGATIVE
Protein, ur: 30 mg/dL — AB
Specific Gravity, Urine: 1.028 (ref 1.005–1.030)
pH: 5 (ref 5.0–8.0)

## 2023-09-01 LAB — POC URINE PREG, ED: Preg Test, Ur: POSITIVE — AB

## 2023-09-01 LAB — COMPREHENSIVE METABOLIC PANEL
ALT: 21 U/L (ref 0–44)
AST: 23 U/L (ref 15–41)
Albumin: 4.4 g/dL (ref 3.5–5.0)
Alkaline Phosphatase: 38 U/L (ref 38–126)
Anion gap: 9 (ref 5–15)
BUN: 9 mg/dL (ref 6–20)
CO2: 22 mmol/L (ref 22–32)
Calcium: 9.1 mg/dL (ref 8.9–10.3)
Chloride: 103 mmol/L (ref 98–111)
Creatinine, Ser: 0.65 mg/dL (ref 0.44–1.00)
GFR, Estimated: >60 mL/min (ref >60)
Glucose, Bld: 99 mg/dL (ref 70–99)
Potassium: 3.2 mmol/L — ABNORMAL LOW (ref 3.5–5.1)
Sodium: 134 mmol/L — ABNORMAL LOW (ref 135–145)
Total Bilirubin: 0.8 mg/dL (ref 0.3–1.2)
Total Protein: 8 g/dL (ref 6.5–8.1)

## 2023-09-01 LAB — CBC WITH DIFFERENTIAL/PLATELET
Abs Immature Granulocytes: 0.03 10*3/uL (ref 0.00–0.07)
Basophils Absolute: 0.1 10*3/uL (ref 0.0–0.1)
Basophils Relative: 1 %
Eosinophils Absolute: 0.1 10*3/uL (ref 0.0–0.5)
Eosinophils Relative: 1 %
HCT: 34.2 % — ABNORMAL LOW (ref 36.0–46.0)
Hemoglobin: 12 g/dL (ref 12.0–15.0)
Immature Granulocytes: 0 %
Lymphocytes Relative: 29 %
Lymphs Abs: 3 10*3/uL (ref 0.7–4.0)
MCH: 29.2 pg (ref 26.0–34.0)
MCHC: 35.1 g/dL (ref 30.0–36.0)
MCV: 83.2 fL (ref 80.0–100.0)
Monocytes Absolute: 0.8 10*3/uL (ref 0.1–1.0)
Monocytes Relative: 8 %
Neutro Abs: 6.3 10*3/uL (ref 1.7–7.7)
Neutrophils Relative %: 61 %
Platelets: 463 10*3/uL — ABNORMAL HIGH (ref 150–400)
RBC: 4.11 MIL/uL (ref 3.87–5.11)
RDW: 12.9 % (ref 11.5–15.5)
WBC: 10.3 10*3/uL (ref 4.0–10.5)
nRBC: 0 % (ref 0.0–0.2)

## 2023-09-01 LAB — HCG, QUANTITATIVE, PREGNANCY: hCG, Beta Chain, Quant, S: 53936 m[IU]/mL — ABNORMAL HIGH (ref <5)

## 2023-09-01 NOTE — ED Notes (Signed)
No answer when called several times from lobby 

## 2023-09-01 NOTE — Patient Instructions (Signed)
  Erlene Quan, thank you for joining Piedad Climes, PA-C for today's virtual visit.  While this provider is not your primary care provider (PCP), if your PCP is located in our provider database this encounter information will be shared with them immediately following your visit.   A [facility] MyChart account gives you access to today's visit and all your visits, tests, and labs performed at [facility] " click here if you don't have a [facility] MyChart account or go to [email or web address]  Consent: (Patient) Carly Singleton provided verbal consent for this virtual visit at the beginning of the encounter.  Current Medications:  Current Outpatient Medications:    acetaminophen-caffeine (EXCEDRIN TENSION HEADACHE) 500-65 MG TABS per tablet, Take 2 tablets by mouth every 8 (eight) hours as needed., Disp: 90 tablet, Rfl: 0   ondansetron (ZOFRAN-ODT) 8 MG disintegrating tablet, Take 1 tablet (8 mg total) by mouth every 8 (eight) hours as needed for nausea or vomiting., Disp: 30 tablet, Rfl: 0   Medications ordered in this encounter:  No orders of the defined types were placed in this encounter.    *If you need refills on other medications prior to your next appointment, please contact your pharmacy*  Follow-Up: Call back or seek an in-person evaluation if the symptoms worsen or if the condition fails to improve as anticipated.  [facility] [phone]  Other Instructions Please have someone carry you to [facility] for an emergent evaluation and so the baby can be assessed as well. I am highly concerned for significant dehydration. DO NOT DELAY CARE.    [facility]  Emergency Department- [facility]  Get Driving Directions  [phone]  [address]  [place], [place] [address]  Open 24/7/365      If you have been instructed to have an in-person evaluation today at a local Urgent Care facility,  please use the link below. It will take you to a list of all of our available [facility], including address, phone number and hours of operation. Please do not delay care.  [facility]  If you or a family member do not have a primary care provider, use the link below to schedule a visit and establish care. When you choose a [facility] primary care physician or advanced practice provider, you gain a long-term partner in health. Find a Primary Care Provider  Learn more about [facility]'s in-office and virtual care options:  - Get Care Now

## 2023-09-01 NOTE — Progress Notes (Signed)
Virtual Visit Consent   [place] Krekel, you are scheduled for a virtual visit with a [facility] provider today. Just as with appointments in the office, your consent must be obtained to participate. Your consent will be active for this visit and any virtual visit you may have with one of our providers in the next 365 days. If you have a MyChart account, a copy of this consent can be sent to you electronically.  As this is a virtual visit, video technology does not allow for your provider to perform a traditional examination. This may limit your provider's ability to fully assess your condition. If your provider identifies any concerns that need to be evaluated in person or the need to arrange testing (such as labs, EKG, etc.), we will make arrangements to do so. Although advances in technology are sophisticated, we cannot ensure that it will always work on either your end or our end. If the connection with a video visit is poor, the visit may have to be switched to a telephone visit. With either a video or telephone visit, we are not always able to ensure that we have a secure connection.  By engaging in this virtual visit, you consent to the provision of healthcare and authorize for your insurance to be billed (if applicable) for the services provided during this visit. Depending on your insurance coverage, you may receive a charge related to this service.  I need to obtain your verbal consent now. Are you willing to proceed with your visit today? Carly Singleton has provided verbal consent on 09/01/2023 for a virtual visit (video or telephone). Piedad Climes, [place]  Date: 09/01/2023 7:33 PM  Virtual Visit via Video Note   I, Piedad Climes, connected with  Carly Singleton  ([id number], April 13, 1996) on 09/01/23 at  7:30 PM EDT by a video-enabled telemedicine application and verified that I am speaking with the correct person using two identifiers.  Location: Patient:  Virtual Visit Location Patient: Home Provider: Virtual Visit Location Provider: Home Office   I discussed the limitations of evaluation and management by telemedicine and the availability of in person appointments. The patient expressed understanding and agreed to proceed.    History of Present Illness: Carly Singleton is a 27 y.o. who identifies as a female who was assigned female at birth, and is being seen today for intractable nausea and vomiting over the past 3 days. Has not been able to get any food in at all and very limited PO fluids. This is despite using her SL Zofran already given for nausea.  Noting dizziness and lightheadedness with movement. Dark urine but still able to pass urine. Denies fever, chills. Notes she has been in bed all day due to weakness. Is currently 8-[redacted] weeks pregnant.    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   Penicillin G Rash   Medications:  Current Outpatient Medications:    acetaminophen-caffeine (EXCEDRIN TENSION HEADACHE) 500-65 MG TABS per tablet, Take 2 tablets by mouth every 8 (eight) hours as needed., Disp: 90 tablet, Rfl: 0   ondansetron (ZOFRAN-ODT) 8 MG disintegrating tablet, Take 1 tablet (8 mg total) by mouth every 8 (eight) hours as needed for nausea or vomiting.,  Disp: 30 tablet, Rfl: 0  Observations/Objective: Patient is well-developed, tearful and weak-appearing. Resting  in bed at home. Head is normocephalic, atraumatic.  No labored breathing. Speech is clear and coherent with logical content.  Patient is alert and oriented at baseline.   Assessment and Plan: 1. Intractable vomiting  2. [redacted] weeks gestation of pregnancy  3. Dizziness  Needs ER  evaluation. She has someone who agrees to take her now. EMS precautions discussed. Will send FYI to PCP. No charge for visit since she needs higher level of care.  Follow Up Instructions: I discussed the assessment and treatment plan with the patient. The patient was provided an opportunity to ask questions and all were answered. The patient agreed with the plan and demonstrated an understanding of the instructions.  A copy of instructions were sent to the patient via MyChart unless otherwise noted below.   The patient was advised to call back or seek an in-person evaluation if the symptoms worsen or if the condition fails to improve as anticipated.  Time:  I spent 10 minutes with the patient via telehealth technology discussing the above problems/concerns.    Piedad Climes, PA-C

## 2023-09-01 NOTE — ED Triage Notes (Signed)
Pt presents ambulatory to triage via POV with complaints of emesis and decreased appetite x 3 days. Pt is approx 5-[redacted] weeks pregnant. A&Ox4 at this time. Denies vaginal bleeding, lightheadedness, CP or SOB.

## 2023-09-02 LAB — GC/CHLAMYDIA PROBE AMP (~~LOC~~) NOT AT ARMC
Chlamydia: NEGATIVE
Comment: NEGATIVE
Comment: NORMAL
Neisseria Gonorrhea: NEGATIVE

## 2023-09-06 ENCOUNTER — Other Ambulatory Visit: Payer: Self-pay

## 2023-09-06 ENCOUNTER — Emergency Department
Admission: EM | Admit: 2023-09-06 | Discharge: 2023-09-06 | Disposition: A | Discharge status: Home / Self Care | Payer: Medicaid Other | Attending: Emergency Medicine | Admitting: Emergency Medicine

## 2023-09-06 ENCOUNTER — Encounter: Payer: Self-pay | Admitting: Emergency Medicine

## 2023-09-06 DIAGNOSIS — O219 Vomiting of pregnancy, unspecified: Secondary | ICD-10-CM | POA: Diagnosis not present

## 2023-09-06 DIAGNOSIS — R079 Chest pain, unspecified: Secondary | ICD-10-CM | POA: Insufficient documentation

## 2023-09-06 DIAGNOSIS — Z3A01 Less than 8 weeks gestation of pregnancy: Secondary | ICD-10-CM | POA: Diagnosis not present

## 2023-09-06 DIAGNOSIS — Z3A09 9 weeks gestation of pregnancy: Secondary | ICD-10-CM | POA: Insufficient documentation

## 2023-09-06 DIAGNOSIS — R0789 Other chest pain: Secondary | ICD-10-CM | POA: Diagnosis not present

## 2023-09-06 LAB — HCG, QUANTITATIVE, PREGNANCY: hCG, Beta Chain, Quant, S: 100940 m[IU]/mL — ABNORMAL HIGH (ref <5)

## 2023-09-06 LAB — HEPATIC FUNCTION PANEL
ALT: 28 U/L (ref 0–44)
AST: 20 U/L (ref 15–41)
Albumin: 4.2 g/dL (ref 3.5–5.0)
Alkaline Phosphatase: 40 U/L (ref 38–126)
Bilirubin, Direct: 0.2 mg/dL (ref 0.0–0.2)
Indirect Bilirubin: 1.2 mg/dL — ABNORMAL HIGH (ref 0.3–0.9)
Total Bilirubin: 1.4 mg/dL — ABNORMAL HIGH (ref 0.3–1.2)
Total Protein: 7.7 g/dL (ref 6.5–8.1)

## 2023-09-06 LAB — BASIC METABOLIC PANEL
Anion gap: 12 (ref 5–15)
BUN: 12 mg/dL (ref 6–20)
CO2: 18 mmol/L — ABNORMAL LOW (ref 22–32)
Calcium: 9.2 mg/dL (ref 8.9–10.3)
Chloride: 104 mmol/L (ref 98–111)
Creatinine, Ser: 0.41 mg/dL — ABNORMAL LOW (ref 0.44–1.00)
GFR, Estimated: >60 mL/min (ref >60)
Glucose, Bld: 90 mg/dL (ref 70–99)
Potassium: 3.5 mmol/L (ref 3.5–5.1)
Sodium: 134 mmol/L — ABNORMAL LOW (ref 135–145)

## 2023-09-06 LAB — TROPONIN I (HIGH SENSITIVITY): Troponin I (High Sensitivity): <2 ng/L (ref <18)

## 2023-09-06 LAB — CBC
HCT: 36.1 % (ref 36.0–46.0)
Hemoglobin: 12.3 g/dL (ref 12.0–15.0)
MCH: 29.1 pg (ref 26.0–34.0)
MCHC: 34.1 g/dL (ref 30.0–36.0)
MCV: 85.5 fL (ref 80.0–100.0)
Platelets: 431 10*3/uL — ABNORMAL HIGH (ref 150–400)
RBC: 4.22 MIL/uL (ref 3.87–5.11)
RDW: 12.8 % (ref 11.5–15.5)
WBC: 10 10*3/uL (ref 4.0–10.5)
nRBC: 0 % (ref 0.0–0.2)

## 2023-09-06 LAB — LIPASE, BLOOD: Lipase: 32 U/L (ref 11–51)

## 2023-09-06 MED ORDER — METOCLOPRAMIDE HCL 5 MG PO TABS: 5.0000 mg | ORAL_TABLET | Freq: Three times a day (TID) | ORAL | 0 refills | Status: DC | PRN

## 2023-09-06 MED ORDER — DIPHENHYDRAMINE HCL (SLEEP) 25 MG PO TBDP: 25.0000 mg | ORAL_TABLET | Freq: Every evening | ORAL | 0 refills | Status: DC | PRN

## 2023-09-06 MED ORDER — VITAMIN B-6 25 MG PO TABS: 25.0000 mg | ORAL_TABLET | Freq: Four times a day (QID) | ORAL | 0 refills | Status: AC | PRN

## 2023-09-06 MED ORDER — SODIUM CHLORIDE 0.9 % IV BOLUS
500.0000 mL | Freq: Once | INTRAVENOUS | Status: AC
  Administered 2023-09-06: 500 mL via INTRAVENOUS

## 2023-09-06 MED ORDER — METOCLOPRAMIDE HCL 5 MG/ML IJ SOLN
5.0000 mg | Freq: Once | INTRAMUSCULAR | Status: AC
  Administered 2023-09-06: 5 mg via INTRAVENOUS
  Filled 2023-09-06: qty 2

## 2023-09-06 NOTE — ED Triage Notes (Signed)
First Nurse Note: Patient to ED via [facility] from home for chest pressure. Pt states it has been intermittent since Saturday and woke her up out of a dead sleep. Currently [redacted] weeks pregnant. Vs WNL

## 2023-09-06 NOTE — ED Provider Notes (Signed)
[facility] Provider Note    Event Date/Time   First MD Initiated Contact with Patient 09/06/23 1650     (approximate)   History   Chest Pain   HPI  Carly Singleton is a 27 y.o. female G3, P2 who presents to the emergency department with nausea and vomiting.  Patient states that she has had nausea and vomiting over the past 1 week.  Currently [redacted] weeks gestational age based on her LMP.  States that she had an early ultrasound that showed a gestational sac but had not yet had an identifiable IUP.  No significant abdominal pain.  Denies any vaginal bleeding or vaginal discharge.  Denies any dysuria, urinary urgency or frequency.  Complaining of chest pain at nighttime that wakes her up from sleep.  States that her chest discomfort occurs around 2:00 in the morning and she feels like there is a fluid sensation or acid going up the middle of her chest.  Denies any shortness of breath.  Denies any active chest pain at this time.   Physical Exam   Triage Vital Signs: ED Triage Vitals  Encounter Vitals Group     BP 09/06/23 1609 (!) 104/51     Systolic BP Percentile --      Diastolic BP Percentile --      Pulse Rate 09/06/23 1609 60     Resp 09/06/23 1609 16     Temp 09/06/23 1609 98.2 F (36.8 C)     Temp Source 09/06/23 1609 Oral     SpO2 09/06/23 1609 100 %     Weight 09/06/23 1557 105 lb 13.1 oz (48 kg)     Height 09/06/23 1557 5\' 1"  (1.549 m)     Head Circumference --      Peak Flow --      Pain Score 09/06/23 1557 0     Pain Loc --      Pain Education --      Exclude from Growth Chart --     Most recent vital signs: Vitals:   09/06/23 1609  BP: (!) 104/51  Pulse: 60  Resp: 16  Temp: 98.2 F (36.8 C)  SpO2: 100%    Physical Exam Constitutional:      Appearance: She is well-developed.  HENT:     Head: Atraumatic.  Eyes:     Conjunctiva/sclera: Conjunctivae normal.  Cardiovascular:     Rate and Rhythm: Regular rhythm.     Heart  sounds: Normal heart sounds. No murmur heard. Pulmonary:     Effort: No respiratory distress.  Abdominal:     General: There is no distension.     Palpations: Abdomen is soft.     Tenderness: There is no abdominal tenderness.  Musculoskeletal:        General: Normal range of motion.     Cervical back: Normal range of motion.  Skin:    General: Skin is warm.     Capillary Refill: Capillary refill takes less than 2 seconds.  Neurological:     Mental Status: She is alert. Mental status is at baseline.     IMPRESSION / MDM / ASSESSMENT AND PLAN / ED COURSE  I reviewed the triage vital signs and the nursing notes.  Differential diagnosis including nausea and vomiting in pregnancy, hyperemesis gravidarum, symptomatic cholelithiasis.   EKG  I, Corena Herter, the attending physician, personally viewed and interpreted this ECG.   Rate: Normal  Rhythm: Normal sinus  Axis: Normal  Intervals:  Normal  ST&T Change: None  No tachycardic or bradycardic dysrhythmias while on cardiac telemetry.   LABS (all labs ordered are listed, but only abnormal results are displayed) Labs interpreted as -    Labs Reviewed  BASIC METABOLIC PANEL - Abnormal; Notable for the following components:      Result Value   Sodium 134 (*)    CO2 18 (*)    Creatinine, Ser 0.41 (*)    All other components within normal limits  CBC - Abnormal; Notable for the following components:   Platelets 431 (*)    All other components within normal limits  HCG, QUANTITATIVE, PREGNANCY - Abnormal; Notable for the following components:   hCG, Beta Chain, Quant, S 100,940 (*)    All other components within normal limits  HEPATIC FUNCTION PANEL - Abnormal; Notable for the following components:   Total Bilirubin 1.4 (*)    Indirect Bilirubin 1.2 (*)    All other components within normal limits  LIPASE, BLOOD  POC URINE PREG, ED  TROPONIN I (HIGH SENSITIVITY)  TROPONIN I (HIGH SENSITIVITY)     MDM  Low  suspicion for ACS, no active chest pain at this time, troponin undetectable.  Low risk heart score.  EKG reassuring.  Do not feel that serial troponins are necessary at this time.  Most likely with acid reflux from her nausea and vomiting associated with pregnancy.  No significant electrolyte abnormalities.  Clinical picture is not consistent with acute cholecystitis, no right upper quadrant abdominal tenderness to palpation and no fever.  Patient was given IV fluids and IV antiemetics.  On reevaluation patient states she is feeling much better.  Tolerating p.o.  Drink ginger ale in the emergency department.  Serial abdominal exam continues to be nontender.  Discussed symptomatic treatment for her nausea and vomiting and close follow-up with her gynecologist.  Given return precautions for any ongoing or worsening symptoms.     PROCEDURES:  Critical Care performed: No  Procedures  Patient's presentation is most consistent with acute complicated illness / injury requiring diagnostic workup.   MEDICATIONS ORDERED IN ED: Medications  sodium chloride 0.9 % bolus 500 mL (500 mLs Intravenous New Bag/Given 09/06/23 1746)  metoCLOPramide (REGLAN) injection 5 mg (5 mg Intravenous Given 09/06/23 1746)    FINAL CLINICAL IMPRESSION(S) / ED DIAGNOSES   Final diagnoses:  Nausea/vomiting in pregnancy     Rx / DC Orders   ED Discharge Orders          Ordered    pyridOXINE (VITAMIN B6) 25 MG tablet  Every 6 hours PRN        09/06/23 1911    diphenhydrAMINE HCl, Sleep, 25 MG TBDP  At bedtime PRN        09/06/23 1911    metoCLOPramide (REGLAN) 5 MG tablet  Every 8 hours PRN        09/06/23 1911             Note:  This document was prepared using Dragon voice recognition software and may include unintentional dictation errors.   Corena Herter, MD 09/06/23 [phone]

## 2023-09-06 NOTE — ED Triage Notes (Signed)
Pt sts that she has been having chest pain for the last couple of days. Pt sts that she woke up with severe chest pain and her heart beating really fast. Pt sts that she also feels dehydrated.

## 2023-09-06 NOTE — Discharge Instructions (Signed)
You are seen in the urgency department for nausea and vomiting during pregnancy.  You were given IV fluids and IV nausea medication.  It is important to stay hydrated and drink plenty of fluids.  You are given a prescription for vitamin B6 to help with nausea and vomiting during pregnancy.  You are also given a prescription for Unisom which you can take at nighttime.  If you are still having severe symptoms of nausea and vomiting you can take 1 tablet of Reglan.  Follow-up closely with your obstetrician.  Return to the emergency department for worsening symptoms.

## 2023-09-09 ENCOUNTER — Other Ambulatory Visit: Payer: Self-pay

## 2023-09-09 ENCOUNTER — Ambulatory Visit (INDEPENDENT_AMBULATORY_CARE_PROVIDER_SITE_OTHER): Payer: Medicaid Other

## 2023-09-09 DIAGNOSIS — Z3481 Encounter for supervision of other normal pregnancy, first trimester: Secondary | ICD-10-CM

## 2023-09-09 DIAGNOSIS — O3680X0 Pregnancy with inconclusive fetal viability, not applicable or unspecified: Secondary | ICD-10-CM

## 2023-09-09 DIAGNOSIS — Z3A01 Less than 8 weeks gestation of pregnancy: Secondary | ICD-10-CM | POA: Diagnosis not present

## 2023-09-22 ENCOUNTER — Other Ambulatory Visit: Payer: Self-pay

## 2023-09-22 ENCOUNTER — Inpatient Hospital Stay (HOSPITAL_COMMUNITY)
Admission: AD | Admit: 2023-09-22 | Discharge: 2023-09-22 | Disposition: A | Discharge status: Home / Self Care | Payer: Medicaid Other | Attending: Obstetrics and Gynecology | Admitting: Obstetrics and Gynecology

## 2023-09-22 DIAGNOSIS — O99891 Other specified diseases and conditions complicating pregnancy: Secondary | ICD-10-CM | POA: Diagnosis not present

## 2023-09-22 DIAGNOSIS — Z3A09 9 weeks gestation of pregnancy: Secondary | ICD-10-CM | POA: Diagnosis not present

## 2023-09-22 DIAGNOSIS — R1032 Left lower quadrant pain: Secondary | ICD-10-CM | POA: Diagnosis not present

## 2023-09-22 DIAGNOSIS — M549 Dorsalgia, unspecified: Secondary | ICD-10-CM | POA: Diagnosis not present

## 2023-09-22 LAB — URINALYSIS, ROUTINE W REFLEX MICROSCOPIC
Bilirubin Urine: NEGATIVE
Glucose, UA: NEGATIVE mg/dL
Hgb urine dipstick: NEGATIVE
Ketones, ur: NEGATIVE mg/dL
Nitrite: NEGATIVE
Protein, ur: NEGATIVE mg/dL
Specific Gravity, Urine: 1.023 (ref 1.005–1.030)
pH: 5 (ref 5.0–8.0)

## 2023-09-22 MED ORDER — ACETAMINOPHEN 500 MG PO TABS
1000.0000 mg | ORAL_TABLET | Freq: Once | ORAL | Status: AC
  Administered 2023-09-22: 1000 mg via ORAL
  Filled 2023-09-22: qty 2

## 2023-09-22 NOTE — MAU Note (Signed)
Carly Singleton is a 27 y.o. at [redacted]w[redacted]d here in MAU reporting: she's been having back pain since Sunday and also has had flutters in he LLQ.  Marland Kitchen  Denies pain or burning with urination.  States does have vaginal itching, denies discharge. Denies VB. LMP: 07/01/2023 Onset of complaint: 3 days ago Pain score: 7 Vitals:   09/22/23 1505  BP: (!) 103/52  Pulse: 86  Resp: 18  Temp: 98 F (36.7 C)  SpO2: 100%     FHT:NA Lab orders placed from triage:   UA

## 2023-09-22 NOTE — Discharge Instructions (Signed)

## 2023-09-22 NOTE — Progress Notes (Signed)
Bedside ultrasound done by Sabas Sous, CNM.  FH beat noted, rate 174 bpm.

## 2023-09-22 NOTE — MAU Provider Note (Signed)
History     CSN: [id number]  Arrival date and time: 09/22/23 1429   Event Date/Time   First Provider Initiated Contact with Patient 09/22/23 1654      Chief Complaint  Patient presents with   Back Pain    Carly Singleton is a 27 y.o. [id number] at [redacted]w[redacted]d who receives care at [facility].  She presents today for back pain.  She states she has been experiencing pain since Sunday and is concerned d/t history of miscarriage.  She states the pain is "like period cramps."  She states the pain was stronger on Sunday, but is milder today. She states she has not tried any medications and rates it a 7/10.  Patient denies vaginal bleeding or discharge.  No recent sexual activity.   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   Supervision of other normal pregnancy, antepartum 09/09/2022    Past Surgical History:  Procedure Laterality Date   WISDOM TOOTH EXTRACTION Left 06/2022   two;    Family History  Problem Relation Age of Onset   Healthy Mother    Healthy Father    Cancer Maternal Grandmother 4       stomach   Cancer Maternal Grandfather 36       stomach   Healthy Paternal Grandmother    Other Paternal Grandfather        liver failure   Down syndrome Daughter    Healthy Son    Diabetes Neg Hx    Heart disease Neg Hx     Social History   Tobacco Use   Smoking status: Never   Smokeless tobacco: Never  Vaping Use   Vaping status: Never Used  Substance Use Topics   Alcohol use: Not Currently    Comment: monthly   Drug use: Not Currently    Types: Marijuana    Comment: last 2021    Allergies:  Allergies  Allergen Reactions   Azithromycin Itching and Rash    Only seen  with IV usage not po medication.   Penicillin G Rash    Medications Prior to Admission  Medication Sig Dispense Refill Last Dose   acetaminophen-caffeine (EXCEDRIN TENSION HEADACHE) 500-65 MG TABS per tablet Take 2 tablets by mouth every 8 (eight) hours as needed. 90 tablet 0    diphenhydrAMINE HCl, Sleep, 25 MG TBDP Take 1 tablet (25 mg total) by mouth at bedtime as needed (nausea). 30 tablet 0    metoCLOPramide (REGLAN) 5 MG tablet Take 1 tablet (5 mg total) by mouth every 8 (eight) hours as needed for up to 5 days for nausea or vomiting. 15 tablet 0    ondansetron (ZOFRAN-ODT) 8 MG disintegrating tablet Take 1 tablet (8 mg total) by mouth every 8 (eight) hours as needed for nausea or vomiting. 30 tablet 0    pyridOXINE (VITAMIN B6) 25 MG tablet Take 1 tablet (25 mg total) by mouth every 6 (six) hours as needed (  nausea/vomiting). 60 tablet 0     Review of Systems  Gastrointestinal:  Positive for nausea (None currently) and vomiting (None currently). Negative for abdominal pain, constipation and diarrhea.  Genitourinary:  Negative for difficulty urinating, dysuria, vaginal bleeding and vaginal discharge.  Musculoskeletal:  Positive for back pain.  Neurological:  Negative for dizziness, light-headedness and headaches.   Physical Exam   Blood pressure (!) 103/52, pulse 86, temperature 98 F (36.7 C), temperature source Oral, resp. rate 18, height 5\' 1"  (1.549 m), weight 47.4 kg, last menstrual period 07/01/2023, SpO2 100%, unknown if currently breastfeeding.  Physical Exam Vitals reviewed.  Constitutional:      Appearance: Normal appearance.  HENT:     Head: Normocephalic and atraumatic.  Eyes:     Conjunctiva/sclera: Conjunctivae normal.  Cardiovascular:     Rate and Rhythm: Normal rate.  Pulmonary:     Effort: Pulmonary effort is normal. No respiratory distress.  Musculoskeletal:        General: Normal range of motion.     Cervical back: Normal range of motion.  Neurological:      Mental Status: She is alert and oriented to person, place, and time.  Psychiatric:        Mood and Affect: Mood normal.        Behavior: Behavior normal.     MAU Course  Procedures Results for orders placed or performed during the hospital encounter of 09/22/23 (from the past 24 hour(s))  Urinalysis, Routine w reflex microscopic -Urine, Clean Catch     Status: Abnormal   Collection Time: 09/22/23  5:15 PM  Result Value Ref Range   Color, Urine YELLOW YELLOW   APPearance HAZY (A) CLEAR   Specific Gravity, Urine 1.023 1.005 - 1.030   pH 5.0 5.0 - 8.0   Glucose, UA NEGATIVE NEGATIVE mg/dL   Hgb urine dipstick NEGATIVE NEGATIVE   Bilirubin Urine NEGATIVE NEGATIVE   Ketones, ur NEGATIVE NEGATIVE mg/dL   Protein, ur NEGATIVE NEGATIVE mg/dL   Nitrite NEGATIVE NEGATIVE   Leukocytes,Ua TRACE (A) NEGATIVE   RBC / HPF 0-5 0 - 5 RBC/hpf   WBC, UA 0-5 0 - 5 WBC/hpf   Bacteria, UA RARE (A) NONE SEEN   Squamous Epithelial / HPF 11-20 0 - 5 /HPF   Mucus PRESENT    Uric Acid Crys, UA PRESENT     Patient informed that the ultrasound is considered a limited OB ultrasound and is not intended to be a complete ultrasound exam.  Patient also informed that the ultrasound is not being completed with the intent of assessing for fetal or placental anomalies or any pelvic abnormalities.  Explained that the purpose of today's ultrasound is to assess for  viability.  Patient acknowledges the purpose of the exam and the limitations of the study.  SIUP at 8.2 by CRL FHR 172 bpm       MDM Labs: UA Pain Medication BSUS Assessment and Plan  27 year old [id number] at 9.0 weeks Abdominal Pain  -POC Reviewed. -Exam performed. -Patient offered and accepts pain medication. -Will give tylenol and heating pack. -If pain improved will perform BSUS. -Patient agreeable.   Cherre Robins 09/22/2023, 4:54 PM   Reassessment (6:55 PM) -Patient reports improvement with tylenol and heat. -BSUS  performed. Results as above. -Precautions reviewed. -Given list of pregnancy safe medications. -Encouraged to call primary office or return to MAU if symptoms worsen or with the onset of new symptoms. -Discharged to home in improved condition.  Shanda Bumps  Fraser Din MSN, CNM Systems developer, Center for [employer]

## 2023-10-11 ENCOUNTER — Other Ambulatory Visit: Payer: Self-pay | Admitting: Obstetrics & Gynecology

## 2023-10-11 ENCOUNTER — Encounter: Payer: Self-pay | Admitting: Obstetrics & Gynecology

## 2023-10-11 ENCOUNTER — Other Ambulatory Visit (HOSPITAL_COMMUNITY)
Admission: RE | Admit: 2023-10-11 | Discharge: 2023-10-11 | Disposition: A | Discharge status: Home / Self Care | Payer: Medicaid Other | Source: Ambulatory Visit | Attending: Obstetrics & Gynecology | Admitting: Obstetrics & Gynecology

## 2023-10-11 ENCOUNTER — Ambulatory Visit (INDEPENDENT_AMBULATORY_CARE_PROVIDER_SITE_OTHER): Payer: Medicaid Other | Admitting: Obstetrics & Gynecology

## 2023-10-11 VITALS — BP 98/61 | HR 92 | Wt 103.4 lb

## 2023-10-11 DIAGNOSIS — Z3A11 11 weeks gestation of pregnancy: Secondary | ICD-10-CM

## 2023-10-11 DIAGNOSIS — Z3480 Encounter for supervision of other normal pregnancy, unspecified trimester: Secondary | ICD-10-CM

## 2023-10-11 DIAGNOSIS — O09299 Supervision of pregnancy with other poor reproductive or obstetric history, unspecified trimester: Secondary | ICD-10-CM | POA: Insufficient documentation

## 2023-10-11 DIAGNOSIS — O09291 Supervision of pregnancy with other poor reproductive or obstetric history, first trimester: Secondary | ICD-10-CM

## 2023-10-11 DIAGNOSIS — Z131 Encounter for screening for diabetes mellitus: Secondary | ICD-10-CM | POA: Diagnosis not present

## 2023-10-11 HISTORY — DX: Supervision of pregnancy with other poor reproductive or obstetric history, unspecified trimester: O09.299

## 2023-10-11 MED ORDER — PRENATAL 28-0.8 MG PO TABS
1.0000 | ORAL_TABLET | Freq: Every day | ORAL | 12 refills | Status: DC
Start: 2023-10-11 — End: 2023-10-11

## 2023-10-11 NOTE — Progress Notes (Signed)
History:   Carly Singleton is a 27 y.o. [id number] at [redacted]w[redacted]d by early ultrasound being seen today for her first obstetrical visit.  Her obstetrical history is significant for  two term SVDs, two SABs and one IAB .  Last child has Trisomy 21. Patient does intend to breast feed. Pregnancy history fully reviewed.  Patient reports no complaints.      HISTORY: OB History  Gravida Para Term Preterm AB Living  6 2 2  0 3 2  SAB IAB Ectopic Multiple Live Births  2 1 0 0 2    # Outcome Date GA Lbr Len/2nd Weight Sex Type Anes PTL Lv  6 Current           5 SAB 11/2022     SAB     4 IAB 05/11/22          3 SAB 2020          2 Term 11/15/15 [redacted]w[redacted]d / 00:24 5 lb 10 oz (2.551 kg) F Vag-Spont EPI  LIV     Birth Comments: facial features suggestive of trisomy 59     Name: Carly Singleton     Apgar1: 7  Apgar5: 9  1 Term 06/19/14 [redacted]w[redacted]d  6 lb 14 oz (3.118 kg) M Vag-Spont None  LIV    Last pap smear was done many years ago and was normal  Past Medical History:  Diagnosis Date   Chlamydia    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   Past Surgical History:  Procedure Laterality Date   WISDOM TOOTH EXTRACTION Left 06/2022   two;   Family History  Problem Relation Age of Onset   Healthy Mother    Healthy Father    Cancer Maternal Grandmother 4       stomach   Cancer Maternal Grandfather 67       stomach   Healthy Paternal Grandmother    Other Paternal Grandfather        liver failure   Down syndrome Daughter    Healthy Son    Diabetes Neg Hx    Heart disease Neg Hx    Social History   Tobacco Use   Smoking status: Never   Smokeless tobacco: Never  Vaping Use   Vaping status: Never Used  Substance Use Topics   Alcohol use: Not Currently    Comment: monthly   Drug use: Not Currently    Types: Marijuana    Comment: last 2021   Allergies  Allergen Reactions   Azithromycin Itching and Rash    Only seen with IV usage not po medication.    Penicillin G Rash   Current Outpatient Medications on File Prior to Visit  Medication Sig Dispense Refill   acetaminophen-caffeine (EXCEDRIN TENSION HEADACHE) 500-65 MG TABS per tablet Take 2 tablets by mouth every 8 (eight) hours as needed. (Patient not taking: Reported on 10/11/2023) 90 tablet 0   diphenhydrAMINE HCl, Sleep, 25 MG TBDP Take 1 tablet (25 mg total) by mouth at bedtime as needed (nausea). 30 tablet 0   metoCLOPramide (REGLAN) 5 MG tablet Take 1 tablet (5 mg total) by mouth every 8 (eight) hours as needed for up to 5 days for nausea or vomiting. 15 tablet 0   ondansetron (ZOFRAN-ODT) 8 MG disintegrating tablet Take 1 tablet (8 mg total) by mouth every 8 (eight) hours as needed for nausea or vomiting. (Patient not taking: Reported on 10/11/2023) 30 tablet 0   No current facility-administered medications on file  prior to visit.    Review of Systems Pertinent items noted in HPI and remainder of comprehensive ROS otherwise negative.  Physical Exam:   Vitals:   10/11/23 1512  BP: 98/61  Pulse: 92  Weight: 103 lb 6.4 oz (46.9 kg)   Fetal Heart Rate (bpm): 168   General: well-developed, well-nourished female in no acute distress  Breasts:  normal appearance, no masses or tenderness bilaterally, exam done in the presence of a chaperone.   Skin: normal coloration and turgor, no rashes  Neurologic: oriented, normal, negative, normal mood  Extremities: normal strength, tone, and muscle mass, ROM of all joints is normal  HEENT PERRLA, extraocular movement intact and sclera clear, anicteric  Neck supple and no masses  Cardiovascular: regular rate and rhythm  Respiratory:  no respiratory distress, normal breath sounds  Abdomen: soft, non-tender; bowel sounds normal; no masses,  no organomegaly  Pelvic: normal external genitalia, no lesions, normal vaginal mucosa, normal vaginal discharge, normal cervix, pap smear done. Exam done in the presence of a chaperone.     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 [redacted] weeks gestation of pregnancy 3. Supervision of other normal pregnancy, antepartum - Cytology - PAP - CBC/D/Plt+RPR+Rh+ABO+RubIgG... - Culture, OB Urine - PANORAMA PRENATAL TEST - HORIZON CUSTOM - Prenatal 28-0.8 MG TABS; Take 1 tablet by mouth daily.  Dispense: 30 tablet; Refill: 12 - Enroll Patient in PreNatal Babyscripts - Babyscripts Schedule Optimization - Hemoglobin A1c - Comprehensive metabolic panel - [place] MFM OB DETAIL +14 WK; Future   Initial labs drawn. Continue prenatal vitamins. Problem list reviewed and updated. Genetic Screening discussed, Panorama and Horizon: ordered. Ultrasound discussed; fetal anatomic survey: scheduled. Anticipatory guidance about prenatal visits given including labs, ultrasounds, and testing. Weight gain recommendations per IOM guidelines reviewed: underweight/BMI 18.5 or less > 28 - 40 lbs; normal weight/BMI 18.5 - 24.9 > 25 - 35 lbs; overweight/BMI 25 - 29.9 > 15 - 25 lbs; obese/BMI  30 or more > 11 - 20 lbs. Discussed usage of the Babyscripts app for more information about pregnancy, and to track blood pressures. Also discussed usage of virtual visits as additional source of managing and completing prenatal visits.  Patient was encouraged to use MyChart to review results, send requests, and have questions addressed.   The nature of [facility] for [facility] Healthcare/Faculty Practice with multiple MDs and Advanced Practice Providers was explained to patient; also emphasized that residents, students are part of our team. Routine obstetric precautions reviewed. Encouraged to seek out care at our  office or emergency room [facility] MAU preferred) for urgent and/or emergent concerns. Return in about 4 weeks (around 11/08/2023) for OFFICE OB VISIT (MD or APP).     Jaynie Collins, MD, FACOG Obstetrician & Gynecologist, [facility] for [employer], [facility]

## 2023-10-11 NOTE — Assessment & Plan Note (Addendum)
  NURSING  PROVIDER  Office Location [facility] Dating by   [facility] Model Traditional Anatomy U/S   Initiated care at  [employer]                Language                LAB RESULTS   Support Person Mardi Mainland NIPS:  AFP:     NT/IT (FT only)     Carrier Screen Horizon:   Rhogam  O/Positive/-- (10/14 1600) A1C/GTT Early: A1C 5.3 Third trimester:   Flu Vaccine     TDaP Vaccine   Blood Type O/Positive/-- (10/14 1600)  Covid Vaccine None  Antibody Negative (10/14 1600)  RSV Vaccine  Rubella 2.48 (10/14 1600)  Feeding Plan  RPR Non Reactive (10/14 1600)  Contraception  HBsAg Negative (10/14 1600)  Circumcision No  HIV Non Reactive (10/14 1600)  Pediatrician   HCVAb Non Reactive (10/14 1600)  Prenatal Classes       Pap Diagnosis  Date Value Ref Range Status  03/29/2019   Final   NEGATIVE FOR INTRAEPITHELIAL LESIONS OR MALIGNANCY.    BTLConsent  GC/CT Initial:   36wks:    VBAC  Consent  GBS   For PCN allergy, check sensitivities        DME Rx [ ]  BP cuff [ ]  Weight Scale Waterbirth  [ ]  Class [ ]  Consent [ ]  CNM visit  PHQ9 & GAD7 [  ] new OB [  ] 28 weeks  [  ] 36 weeks Induction  [ ]  Orders Entered [ ] Foley Y/N

## 2023-10-12 LAB — CBC/D/PLT+RPR+RH+ABO+RUBIGG...
Antibody Screen: NEGATIVE
Basophils Absolute: 0.1 10*3/uL (ref 0.0–0.2)
Basos: 1 %
EOS (ABSOLUTE): 0.1 10*3/uL (ref 0.0–0.4)
Eos: 1 %
HCV Ab: NONREACTIVE
HIV Screen 4th Generation wRfx: NONREACTIVE
Hematocrit: 37.1 % (ref 34.0–46.6)
Hemoglobin: 12.3 g/dL (ref 11.1–15.9)
Hepatitis B Surface Ag: NEGATIVE
Immature Grans (Abs): 0 10*3/uL (ref 0.0–0.1)
Immature Granulocytes: 0 %
Lymphocytes Absolute: 1.7 10*3/uL (ref 0.7–3.1)
Lymphs: 16 %
MCH: 29.8 pg (ref 26.6–33.0)
MCHC: 33.2 g/dL (ref 31.5–35.7)
MCV: 90 fL (ref 79–97)
Monocytes Absolute: 0.6 10*3/uL (ref 0.1–0.9)
Monocytes: 6 %
Neutrophils Absolute: 8 10*3/uL — ABNORMAL HIGH (ref 1.4–7.0)
Neutrophils: 76 %
Platelets: 493 10*3/uL — ABNORMAL HIGH (ref 150–450)
RBC: 4.13 x10E6/uL (ref 3.77–5.28)
RDW: 13.5 % (ref 11.7–15.4)
RPR Ser Ql: NONREACTIVE
Rh Factor: POSITIVE
Rubella Antibodies, IGG: 2.48 {index} (ref >0.99)
WBC: 10.4 10*3/uL (ref 3.4–10.8)

## 2023-10-12 LAB — COMPREHENSIVE METABOLIC PANEL
ALT: 7 [IU]/L (ref 0–32)
AST: 13 [IU]/L (ref 0–40)
Albumin: 4.4 g/dL (ref 4.0–5.0)
Alkaline Phosphatase: 61 [IU]/L (ref 44–121)
BUN/Creatinine Ratio: 14 (ref 9–23)
BUN: 7 mg/dL (ref 6–20)
Bilirubin Total: 0.5 mg/dL (ref 0.0–1.2)
CO2: 20 mmol/L (ref 20–29)
Calcium: 9.7 mg/dL (ref 8.7–10.2)
Chloride: 99 mmol/L (ref 96–106)
Creatinine, Ser: 0.5 mg/dL — ABNORMAL LOW (ref 0.57–1.00)
Globulin, Total: 3 g/dL (ref 1.5–4.5)
Glucose: 68 mg/dL — ABNORMAL LOW (ref 70–99)
Potassium: 4 mmol/L (ref 3.5–5.2)
Sodium: 137 mmol/L (ref 134–144)
Total Protein: 7.4 g/dL (ref 6.0–8.5)
eGFR: 133 mL/min/{1.73_m2} (ref >59)

## 2023-10-12 LAB — HEMOGLOBIN A1C
Est. average glucose Bld gHb Est-mCnc: 105 mg/dL
Hgb A1c MFr Bld: 5.3 % (ref 4.8–5.6)

## 2023-10-12 LAB — HCV INTERPRETATION

## 2023-10-14 LAB — CYTOLOGY - PAP
Chlamydia: NEGATIVE
Comment: NEGATIVE
Comment: NEGATIVE
Comment: NORMAL
Diagnosis: NEGATIVE
Neisseria Gonorrhea: NEGATIVE
Trichomonas: NEGATIVE

## 2023-10-14 LAB — URINE CULTURE, OB REFLEX

## 2023-10-14 LAB — CULTURE, OB URINE

## 2023-10-19 LAB — PANORAMA PRENATAL TEST FULL PANEL:PANORAMA TEST PLUS 5 ADDITIONAL MICRODELETIONS: FETAL FRACTION: 14.7

## 2023-10-21 LAB — HORIZON CUSTOM: REPORT SUMMARY: NEGATIVE

## 2023-11-09 ENCOUNTER — Encounter: Payer: Medicaid Other | Admitting: Obstetrics and Gynecology

## 2023-12-01 ENCOUNTER — Ambulatory Visit: Payer: Medicaid Other

## 2023-12-01 ENCOUNTER — Other Ambulatory Visit: Payer: Medicaid Other

## 2023-12-02 ENCOUNTER — Other Ambulatory Visit (HOSPITAL_COMMUNITY)
Admission: RE | Admit: 2023-12-02 | Discharge: 2023-12-02 | Disposition: A | Discharge status: Home / Self Care | Payer: Medicaid Other | Source: Ambulatory Visit | Attending: Obstetrics and Gynecology | Admitting: Obstetrics and Gynecology

## 2023-12-02 ENCOUNTER — Ambulatory Visit (INDEPENDENT_AMBULATORY_CARE_PROVIDER_SITE_OTHER): Payer: Medicaid Other | Admitting: Obstetrics and Gynecology

## 2023-12-02 VITALS — BP 102/63 | HR 73 | Wt 110.0 lb

## 2023-12-02 DIAGNOSIS — O09299 Supervision of pregnancy with other poor reproductive or obstetric history, unspecified trimester: Secondary | ICD-10-CM | POA: Diagnosis not present

## 2023-12-02 DIAGNOSIS — Z3A19 19 weeks gestation of pregnancy: Secondary | ICD-10-CM | POA: Diagnosis not present

## 2023-12-02 DIAGNOSIS — O23592 Infection of other part of genital tract in pregnancy, second trimester: Secondary | ICD-10-CM

## 2023-12-02 DIAGNOSIS — N898 Other specified noninflammatory disorders of vagina: Secondary | ICD-10-CM | POA: Insufficient documentation

## 2023-12-02 DIAGNOSIS — Z8619 Personal history of other infectious and parasitic diseases: Secondary | ICD-10-CM

## 2023-12-02 NOTE — Progress Notes (Signed)
   PRENATAL VISIT NOTE  Subjective:  Carly Singleton is a 27 y.o. [id number] at [redacted]w[redacted]d being seen today for ongoing prenatal care.  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  Patient reports vaginal irritation.  Contractions: Not present. Vag. Bleeding: None.  Movement: Present. Denies leaking of fluid.   The following portions of the patient's history were reviewed and updated as appropriate: allergies, current medications, past family history, past medical history, past social history, past surgical history and problem list.   Objective:   Vitals:   12/02/23 0933  BP: 102/63  Pulse: 73  Weight: 110 lb (49.9 kg)    Fetal Status: Fetal Heart Rate (bpm): 152   Movement: Present     General:  Alert, oriented and cooperative. Patient is in no acute distress.  Skin: Skin is warm and dry. No rash noted.   Cardiovascular: Normal heart rate noted  Respiratory: Normal respiratory effort, no problems with respiration noted  Abdomen: Soft, gravid, appropriate for gestational age.  Pain/Pressure: Absent     Pelvic: Cervical exam deferred        Extremities: Normal range of motion.     Mental Status: Normal mood and affect. Normal behavior. Normal judgment and thought content.   Assessment and Plan:  Pregnancy: [id number] at [redacted]w[redacted]d 1. Vaginal irritation F/u labs, self swab today - Cervicovaginal ancillary only - POCT Urinalysis Dipstick - Culture, OB Urine  2. [redacted] weeks gestation of pregnancy Pt amenable to screening today. F/u anatomy u/s - AFP, Serum, Open Spina Bifida  3. 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 Please refer to After Visit Summary for other counseling recommendations.   Return in about 5 weeks (around 01/06/2024) for low risk ob, in person, md or app.  Future Appointments  Date Time Provider Department Center  12/08/2023  8:15 AM [facility] NURSE [facility]  12/08/2023  8:30 AM [facility] US6 [facility]    Hepler Bing, MD

## 2023-12-03 LAB — CERVICOVAGINAL ANCILLARY ONLY
Bacterial Vaginitis (gardnerella): NEGATIVE
Candida Glabrata: NEGATIVE
Candida Vaginitis: POSITIVE — AB
Comment: NEGATIVE
Comment: NEGATIVE
Comment: NEGATIVE

## 2023-12-04 LAB — CULTURE, OB URINE

## 2023-12-04 LAB — URINE CULTURE, OB REFLEX

## 2023-12-05 LAB — AFP, SERUM, OPEN SPINA BIFIDA
AFP MoM: 1.2
AFP Value: 72.7 ng/mL
Gest. Age on Collection Date: 19 wk
Maternal Age At EDD: 26.3 a
OSBR Risk 1 IN: 6499
Test Results:: NEGATIVE
Weight: 110 [lb_av]

## 2023-12-08 ENCOUNTER — Ambulatory Visit: Payer: Medicaid Other | Attending: Obstetrics & Gynecology

## 2023-12-08 ENCOUNTER — Ambulatory Visit: Payer: Medicaid Other

## 2023-12-08 DIAGNOSIS — O09292 Supervision of pregnancy with other poor reproductive or obstetric history, second trimester: Secondary | ICD-10-CM

## 2023-12-08 DIAGNOSIS — Z3A20 20 weeks gestation of pregnancy: Secondary | ICD-10-CM | POA: Insufficient documentation

## 2023-12-08 DIAGNOSIS — Z8279 Family history of other congenital malformations, deformations and chromosomal abnormalities: Secondary | ICD-10-CM | POA: Insufficient documentation

## 2023-12-08 DIAGNOSIS — Z3A11 11 weeks gestation of pregnancy: Secondary | ICD-10-CM | POA: Insufficient documentation

## 2023-12-08 DIAGNOSIS — O09299 Supervision of pregnancy with other poor reproductive or obstetric history, unspecified trimester: Secondary | ICD-10-CM | POA: Insufficient documentation

## 2023-12-08 DIAGNOSIS — Z3480 Encounter for supervision of other normal pregnancy, unspecified trimester: Secondary | ICD-10-CM | POA: Insufficient documentation

## 2023-12-29 NOTE — L&D Delivery Note (Signed)
 OB/GYN Faculty Practice Delivery Note  Carly Singleton is a 28 y.o. [id number] s/p SVD at [redacted]w[redacted]d. She was admitted for IOL gHTN.   ROM: 9h 77m with clear fluid GBS Status:  Negative/-- (04/04 0953) Maximum Maternal Temperature: 98.37F  Labor Progress: Initial SVE: 3/60/B. She received Pit and AROM augmentation. She then progressed to complete.   Delivery Date/Time: 0219 4/25 Delivery: Called to room and patient was complete and pushing. Head delivered ROA. No nuchal cord present. Shoulder and body delivered in usual fashion. Infant with spontaneous cry, placed on mother's abdomen, dried and stimulated. Cord clamped x 2 after 1-minute delay, and cut by FOB. Cord blood drawn. Placenta delivered spontaneously with gentle cord traction. Fundus firm with massage and Pitocin . Labia, perineum, vagina, and cervix inspected with R labial extending to R periurethral.  Foley left in place and laceration repaired with 3-0 Monocryl in usual running fashion.  No apparent tension on foley to suggest suture within urethral space. Mom and baby doing well.  Baby Weight: pending  Placenta: 3 vessel, intact. Sent to L&D Complications: None Lacerations: as above EBL: 178 mL Analgesia: Epidural   Infant:  APGAR (1 MIN): 9  APGAR (5 MINS): 9   Ebony Goldstein, MD [facility] Family Medicine Fellow, [facility] for [facility], [facility] 04/21/2024, 2:47 AM

## 2024-01-06 ENCOUNTER — Encounter: Payer: Medicaid Other | Admitting: Obstetrics & Gynecology

## 2024-01-13 ENCOUNTER — Ambulatory Visit (INDEPENDENT_AMBULATORY_CARE_PROVIDER_SITE_OTHER): Payer: Medicaid Other | Admitting: Obstetrics & Gynecology

## 2024-01-13 VITALS — BP 105/63 | HR 90 | Wt 115.0 lb

## 2024-01-13 DIAGNOSIS — Z3480 Encounter for supervision of other normal pregnancy, unspecified trimester: Secondary | ICD-10-CM

## 2024-01-13 DIAGNOSIS — Z3A25 25 weeks gestation of pregnancy: Secondary | ICD-10-CM

## 2024-01-13 DIAGNOSIS — Z3482 Encounter for supervision of other normal pregnancy, second trimester: Secondary | ICD-10-CM

## 2024-01-13 NOTE — Progress Notes (Signed)
   PRENATAL VISIT NOTE  Subjective:  Carly Singleton is a 28 y.o. [id number] at [redacted]w[redacted]d being seen today for ongoing prenatal care.  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  Patient reports no complaints.  Contractions: Not present. Vag. Bleeding: None.  Movement: Present. Denies leaking of fluid.   The following portions of the patient's history were reviewed and updated as appropriate: allergies, current medications, past family history, past medical history, past social history, past surgical history and problem list.   Objective:   Vitals:   01/13/24 1348  BP: 105/63  Pulse: 90  Weight: 115 lb (52.2 kg)    Fetal Status: Fetal Heart Rate (bpm): 150 Fundal Height: 25 cm Movement: Present     General:  Alert, oriented and cooperative. Patient is in no acute distress.  Skin: Skin is warm and dry. No rash noted.   Cardiovascular: Normal heart rate noted  Respiratory: Normal respiratory effort, no problems with respiration noted  Abdomen: Soft, gravid, appropriate for gestational age.  Pain/Pressure: Absent     Pelvic: Cervical exam deferred        Extremities: Normal range of motion.  Edema: None  Mental Status: Normal mood and affect. Normal behavior. Normal judgment and thought content.   Assessment and Plan:  Pregnancy: [id number] at [redacted]w[redacted]d 1. [redacted] weeks gestation of pregnancy 2. Supervision of other normal pregnancy, antepartum (Primary) Discussed need for GTT, third trimester labs next visit. Information about Tdap given.   Preterm labor symptoms and general obstetric precautions including but not limited to vaginal bleeding, contractions, leaking of fluid and fetal movement were reviewed in detail with the patient. Please refer to After Visit Summary for other  counseling recommendations.   Return in about 3 weeks (around 02/03/2024) for 2 hr GTT, 3rd trimester labs, TDap, OFFICE OB VISIT (MD only).  Future Appointments  Date Time Provider Department Center  02/04/2024  8:30 AM [facility] LAB [facility]  02/04/2024  8:55 AM Kimmie Doren, Jethro Bastos, MD [facility]  02/17/2024  8:35 AM Zuriel Roskos, Jethro Bastos, MD [facility]  03/02/2024  8:35 AM Penasco Bing, MD [facility]  03/16/2024  8:35 AM Inari Shin, Jethro Bastos, MD [facility]    Jaynie Collins, MD

## 2024-01-13 NOTE — Patient Instructions (Signed)
 Return to office for any scheduled appointments. Call the office or go to the MAU at [facility] at [facility] if: You begin to have strong, frequent contractions Your water breaks.  Sometimes it is a big gush of fluid, sometimes it is just a trickle that keeps getting your underwear wet or running down your legs You have vaginal bleeding.  It is normal to have a small amount of spotting if your cervix was checked.  You do not feel your baby moving like normal.  If you do not, get something to eat and drink and lay down and focus on feeling your baby move.   If your baby is still not moving like normal, you should call the office or go to MAU. Any other obstetric concerns.  Oral Glucose Tolerance Test During Pregnancy Why am I having this test? The oral glucose tolerance test (GTT) is done to check how your body processes blood sugar (glucose). This is one of several tests used to diagnose diabetes that develops during pregnancy (gestational diabetes mellitus). Gestational diabetes is a short-term form of diabetes that some women develop while they are pregnant. It usually occurs during the second or third trimester of pregnancy and goes away after delivery. Testing, or screening, for gestational diabetes usually occurs around 73 of pregnancy. This test may also be needed earlier if: You have a history of gestational diabetes. There is a history of giving birth to very large babies or of losing pregnancies (having stillbirths). You have signs and symptoms of diabetes, such as: Changes in your eyesight. Tingling or numbness in your hands or feet. Changes in hunger, thirst, and urination, and these are not explained by your pregnancy. What is being tested? This test measures the amount of glucose in your blood at different times during a period of 2 hours. This shows how well your body can process glucose.  You will have three separate blood draws. What kind of sample is  taken?  Blood samples are required for this test. They are usually collected by inserting a needle into a blood vessel. How do I prepare for this test? For 3 days before your test, eat normally. Have plenty of carbohydrate-rich foods. You will be asked not to eat or drink anything other than water (to fast) starting 8-10 hours before the test. Tell a health care provider about: All medicines you are taking, including vitamins, herbs, eye drops, creams, and over-the-counter medicines. Any blood disorders you have. Any surgeries you have had. Any medical conditions you have. What happens during the test? First, your blood glucose will be measured. This is referred to as your fasting blood glucose because you fasted before the test. Then, you will drink a glucose solution that contains a certain amount of glucose. Your blood glucose will be measured again 1 and 2 hours after you drink the solution. This test takes about 2 hours to complete. You will need to stay at the testing location during this time. During the testing period: Do not eat or drink anything other than the glucose solution. Do not exercise. Do not use any products that contain nicotine or tobacco, such as cigarettes, e-cigarettes, and chewing tobacco. These can affect your test results. If you need help quitting, ask your health care provider. The testing procedure may vary among health care providers and hospitals. How are the results reported? Your results will be reported as milligrams of glucose per deciliter of blood (mg/dL) or millimoles per liter (mmol/L). There  is more than one source for screening and diagnosis reference values used to diagnose gestational diabetes. Your health care provider will compare your results to normal values that were established after testing a large group of people (reference values). Reference values may vary among labs and hospitals. For this test, reference values are: Fasting: 92 mg/dL 1  hour: 952 mg/dL  2 hour: 841 mg/dL   What do the results mean? Results below the reference values are considered normal. If one or more of your blood glucose levels are at or above the reference values, you will be diagnosed with gestational diabetes.  Talk with your health care provider about what your results mean. Questions to ask your health care provider Ask your health care provider, or the department that is doing the test: When will my results be ready? How will I get my results? What are my treatment options? What other tests do I need? What are my next steps? Summary The oral glucose tolerance test (GTT) is one of several tests used to diagnose diabetes that develops during pregnancy (gestational diabetes mellitus). Gestational diabetes is a short-term form of diabetes that some women develop while they are pregnant. You may also have this test if you have any symptoms or risk factors for this type of diabetes. Talk with your health care provider about what your results mean. This information is not intended to replace advice given to you by your health care provider. Make sure you discuss any questions you have with your health care provider.  TDaP Vaccine Pregnancy Get the Whooping Cough Vaccine While You Are Pregnant (CDC)  It is important for women to get the whooping cough vaccine in the third trimester of each pregnancy. Vaccines are the best way to prevent this disease. There are 2 different whooping cough vaccines. Both vaccines combine protection against whooping cough, tetanus and diphtheria, but they are for different age groups: Tdap: for everyone 11 years or older, including pregnant women  DTaP: for children 2 months through 57 years of age  You need the whooping cough vaccine during each of your pregnancies The recommended time to get the shot is during your 27th through 36th week of pregnancy, preferably during the earlier part of this time period. The Centers for  Disease Control and Prevention (CDC) recommends that pregnant women receive the whooping cough vaccine for adolescents and adults (called Tdap vaccine) during the third trimester of each pregnancy. The recommended time to get the shot is during your 27th through 36th week of pregnancy, preferably during the earlier part of this time period. This replaces the original recommendation that pregnant women get the vaccine only if they had not previously received it. The [employer]nec[employer] and the [employer] support this recommendation.  You should get the whooping cough vaccine while pregnant to pass protection to your baby frame support disabled and/or not supported in this browser  Learn why Vernona Rieger decided to get the whooping cough vaccine in her 3rd trimester of pregnancy and how her baby girl was born with some protection against the disease. Also available on YouTube. After receiving the whooping cough vaccine, your body will create protective antibodies (proteins produced by the body to fight off diseases) and pass some of them to your baby before birth. These antibodies provide your baby some short-term protection against whooping cough in early life. These antibodies can also protect your baby from some of the more serious complications that come along with  whooping cough. Your protective antibodies are at their highest about 2 weeks after getting the vaccine, but it takes time to pass them to your baby. So the preferred time to get the whooping cough vaccine is early in your third trimester. The amount of whooping cough antibodies in your body decreases over time. That is why CDC recommends you get a whooping cough vaccine during each pregnancy. Doing so allows each of your babies to get the greatest number of protective antibodies from you. This means each of your babies will get the best protection possible against this disease.  Getting the  whooping cough vaccine while pregnant is better than getting the vaccine after you give birth Whooping cough vaccination during pregnancy is ideal so your baby will have short-term protection as soon as he is born. This early protection is important because your baby will not start getting his whooping cough vaccines until he is 2 months old. These first few months of life are when your baby is at greatest risk for catching whooping cough. This is also when he's at greatest risk for having severe, potentially life-threating complications from the infection. To avoid that gap in protection, it is best to get a whooping cough vaccine during pregnancy. You will then pass protection to your baby before he is born. To continue protecting your baby, he should get whooping cough vaccines starting at 2 months old. You may never have gotten the Tdap vaccine before and did not get it during this pregnancy. If so, you should make sure to get the vaccine immediately after you give birth, before leaving the hospital or birthing center. It will take about 2 weeks before your body develops protection (antibodies) in response to the vaccine. Once you have protection from the vaccine, you are less likely to give whooping cough to your newborn while caring for him. But remember, your baby will still be at risk for catching whooping cough from others. A recent study looked to see how effective Tdap was at preventing whooping cough in babies whose mothers got the vaccine while pregnant or in the hospital after giving birth. The study found that getting Tdap between 27 through 36 weeks of pregnancy is 85% more effective at preventing whooping cough in babies younger than 2 months old. Blood tests cannot tell if you need a whooping cough vaccine There are no blood tests that can tell you if you have enough antibodies in your body to protect yourself or your baby against whooping cough. Even if you have been sick with whooping cough  in the past or previously received the vaccine, you still should get the vaccine during each pregnancy. Breastfeeding may pass some protective antibodies onto your baby By breastfeeding, you may pass some antibodies you have made in response to the vaccine to your baby. When you get a whooping cough vaccine during your pregnancy, you will have antibodies in your breast milk that you can share with your baby as soon as your milk comes in. However, your baby will not get protective antibodies immediately if you wait to get the whooping cough vaccine until after delivering your baby. This is because it takes about 2 weeks for your body to create antibodies. Learn more about the health benefits of breastfeeding.   RSV Vaccination for Pregnant People  CDC recommends two ways to protect babies from getting very sick with Respiratory Syncytial Virus (RSV):  An RSV vaccination given during pregnancy  Pfizer's vaccine Verdis Frederickson) is recommended for use during  pregnancy. It is given during RSV season to people who are 32 through [redacted] weeks pregnant.  Or, An RSV immunization given directly to infants and some older babies  Babies born to mothers who get RSV vaccine at least 2 weeks before delivery will have protection and, in most cases, should not need an RSV immunization later.    When is RSV season?  In most regions of the [place] RSV season starts in the fall and peaks in the winter, but the timing and severity of RSV season can vary from place to place and year to year.   The goal of maternal RSV vaccination is to protect babies from getting very sick with RSV during their first RSV season.  In most of the [place], this means maternal RSV vaccine will be given in September through January.  Who should get the maternal RSV vaccine?  People who are 6 through [redacted] weeks pregnant during September through January should get one dose of maternal RSV vaccine to protect their babies.  RSV season can vary around the country.   How is the maternal RSV vaccine administered?  Maternal RSV vaccine is given as a shot into the mother's upper arm. Only a single dose (one shot) of maternal RSV vaccine is recommended.   It is not yet known whether another dose might be needed in later pregnancies.  How well does the maternal RSV vaccine work?  When someone gets RSV vaccine, their body responds by making a protein that protects against the virus that causes RSV. The process takes about 2 weeks. When a pregnant person gets RSV vaccine, their protective proteins (called antibodies) also pass to their baby. So, babies who are born at least 2 weeks after their mother gets RSV vaccine are protected at birth, when infants are at the highest risk of severe RSV disease.   The vaccine can reduce a baby's risk of being hospitalized from RSV by 57% in the first six months after birth.  What are the possible side effects of the maternal RSV vaccine?  In the clinical trials, the side effects most often reported by pregnant people who received the maternal RSV vaccine were pain at the injection site, headache, muscle pain, and nausea.  Although not common, a dangerous high blood pressure condition called pre-eclampsia occurred in 1.8% of pregnant people who received the maternal RSV vaccine compared to 1.4% of pregnant people who received a placebo.  The clinical trials identified a small increase in the number of preterm births in vaccinated pregnant people. It is not clear if this is a true safety problem related to RSV vaccine or if this occurred for reasons unrelated to vaccination.  To reduce the potential risk of preterm birth and complications from RSV disease, FDA approved the maternal RSV vaccine for use during weeks 32 through 38 of pregnancy while additional studies are conducted.  FDA is requiring the manufacturer to do additional studies that will look more closely at the potential  risk of preterm births and pregnancy-related high blood pressure issues in mothers, including pre-eclampsia.  Severe allergic reactions to vaccines are rare but can happen after any vaccine and can be life-threatening. If you see signs of a severe allergic reaction after vaccination (hives, swelling of the face and throat, difficulty breathing, a fast heartbeat, dizziness, or weakness), seek immediate medical care by calling 911.  As with any medicine or vaccine there is a very remote chance of the vaccine causing other serious injury  or death after vaccination.  Adverse events following vaccination should be reported to the Vaccine Adverse Event Reporting System (VAERS), even if it's not clear that the vaccine caused the adverse event. You or your doctor can report an adverse event to [facility] and FDA through VAERS. If you need further assistance reporting to VAERS, please email [email or web address] or call [phone].  If you have any questions about side effects from the maternal RSV vaccine, talk with your healthcare provider.  Do I need a prescription for a maternal RSV vaccine?  Until the vaccine available in the office, you will need a prescription to take to a local pharmacy that is providing the vaccine.   How do I pay for the maternal RSV vaccine?  Most private health insurance plans cover the maternal RSV vaccine, but there may be a cost to you depending on your plan.  Contact your insurer to find out.  Medicaid Beginning September 27, 2022, most people with coverage from [facility] and [employer] [facility]) will be guaranteed coverage of all vaccines recommended by the Advisory Committee on Immunization Practice at no cost to them.   Source: [facility] for Immunization and Respiratory Diseases

## 2024-01-20 ENCOUNTER — Telehealth: Payer: Self-pay | Admitting: *Deleted

## 2024-01-20 ENCOUNTER — Encounter: Payer: Medicaid Other | Admitting: Obstetrics & Gynecology

## 2024-01-20 ENCOUNTER — Inpatient Hospital Stay (HOSPITAL_COMMUNITY)
Admission: AD | Admit: 2024-01-20 | Discharge: 2024-01-20 | Disposition: A | Discharge status: Home / Self Care | Payer: Medicaid Other | Attending: Obstetrics and Gynecology | Admitting: Obstetrics and Gynecology

## 2024-01-20 ENCOUNTER — Encounter (HOSPITAL_COMMUNITY): Payer: Self-pay | Admitting: Obstetrics and Gynecology

## 2024-01-20 DIAGNOSIS — O26852 Spotting complicating pregnancy, second trimester: Secondary | ICD-10-CM

## 2024-01-20 DIAGNOSIS — Z3A26 26 weeks gestation of pregnancy: Secondary | ICD-10-CM | POA: Diagnosis not present

## 2024-01-20 DIAGNOSIS — Z3492 Encounter for supervision of normal pregnancy, unspecified, second trimester: Secondary | ICD-10-CM

## 2024-01-20 DIAGNOSIS — O4692 Antepartum hemorrhage, unspecified, second trimester: Secondary | ICD-10-CM | POA: Diagnosis not present

## 2024-01-20 DIAGNOSIS — N888 Other specified noninflammatory disorders of cervix uteri: Secondary | ICD-10-CM | POA: Insufficient documentation

## 2024-01-20 DIAGNOSIS — O23592 Infection of other part of genital tract in pregnancy, second trimester: Secondary | ICD-10-CM | POA: Insufficient documentation

## 2024-01-20 DIAGNOSIS — O26892 Other specified pregnancy related conditions, second trimester: Secondary | ICD-10-CM | POA: Diagnosis not present

## 2024-01-20 LAB — URINALYSIS, ROUTINE W REFLEX MICROSCOPIC
Bacteria, UA: NONE SEEN
Bilirubin Urine: NEGATIVE
Glucose, UA: NEGATIVE mg/dL
Hgb urine dipstick: NEGATIVE
Ketones, ur: NEGATIVE mg/dL
Nitrite: NEGATIVE
Protein, ur: NEGATIVE mg/dL
Specific Gravity, Urine: 1.019 (ref 1.005–1.030)
pH: 6 (ref 5.0–8.0)

## 2024-01-20 LAB — GC/CHLAMYDIA PROBE AMP (~~LOC~~) NOT AT ARMC
Chlamydia: NEGATIVE
Comment: NEGATIVE
Comment: NORMAL
Neisseria Gonorrhea: NEGATIVE

## 2024-01-20 LAB — WET PREP, GENITAL
Clue Cells Wet Prep HPF POC: NONE SEEN
Sperm: NONE SEEN
Trich, Wet Prep: NONE SEEN
WBC, Wet Prep HPF POC: >=10 — AB (ref <10)
Yeast Wet Prep HPF POC: NONE SEEN

## 2024-01-20 NOTE — Telephone Encounter (Signed)
Pt called and states she had back pains last night and wasn't able to sleep good, this morning she is having period like cramps and when she went to bathroom she had a gush a pink fluid with blood and clots. Advised pt to put a pad on and head to the [facility] to be evaluated for  PTL and PProm. Will make [facility] aware.

## 2024-01-20 NOTE — MAU Provider Note (Signed)
History  Carly Singleton , a  28 y.o. [phone] at [redacted]w[redacted]d presents to MAU for concerns of spotting, clots and back pain, she reports began this morning at ~0830. Patient reports she had lower back pain all yesterday but increased early this morning at ~0100. She now reports pain has mostly resolved but states it has radiated up her right side and is a 3/10. She then took a shower to help with the pain which did not improve. She reports later while using the bathroom and noticed some light pink discharge on the tissue when she wiped and a small dime sized clot. She called triage nurse and she was advised to place a pad on and report to MAU.  She endorses regular, daily FM, denies abnormal vaginal discharge, dysuria, N/V/D, constipation, fever, or chills. When asked, patient denies drinking any water today since waking up. Patient also reports not having IC x1 week.    RN note: Carly Singleton is a 28 y.o. at [redacted]w[redacted]d here in MAU reporting: She reports she felt a trickle of fluids around 0830 this morning and when she went to the restroom and wiped she noted light pink vaginal discharge with one dime sized blood clot. Denies vaginal itching and vaginal odors. Denies feeling any leaking since then. Nothing on her pad since applying it earlier this morning. +FM.    Denies complications with her placenta. Denies recent IC.   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    Past Surgical History:   Procedure Laterality Date   WISDOM TOOTH EXTRACTION Left 06/2022   two;    Family History  Problem Relation Age of Onset   Healthy Mother    Healthy Father    Cancer Maternal Grandmother 52       stomach   Cancer Maternal Grandfather 64       stomach   Healthy Paternal Grandmother    Other Paternal Grandfather        liver failure   Down syndrome Daughter    Healthy Son    Diabetes Neg Hx    Heart disease Neg Hx     Social History   Tobacco Use   Smoking status: Never   Smokeless tobacco: Never  Vaping Use   Vaping status: Never Used  Substance Use Topics   Alcohol use: Not Currently    Comment: monthly   Drug use: Not Currently    Types: Marijuana    Comment: last 2021    Allergies:  Allergies  Allergen Reactions   Azithromycin Itching and Rash    Only seen with IV usage not po medication.   Penicillin G Rash    Medications Prior to  Admission  Medication Sig Dispense Refill Last Dose/Taking   Prenatal Vit-Fe Fumarate-FA (M-NATAL PLUS) 27-1 MG TABS TAKE 1 TABLET BY MOUTH ONCE DAILY 30 tablet 12 01/20/2024   acetaminophen-caffeine (EXCEDRIN TENSION HEADACHE) 500-65 MG TABS per tablet Take 2 tablets by mouth every 8 (eight) hours as needed. (Patient not taking: Reported on 01/13/2024) 90 tablet 0    diphenhydrAMINE HCl, Sleep, 25 MG TBDP Take 1 tablet (25 mg total) by mouth at bedtime as needed (nausea). 30 tablet 0    metoCLOPramide (REGLAN) 5 MG tablet Take 1 tablet (5 mg total) by mouth every 8 (eight) hours as needed for up to 5 days for nausea or vomiting. 15 tablet 0    ondansetron (ZOFRAN-ODT) 8 MG disintegrating tablet Take 1 tablet (8 mg total) by mouth every 8 (eight) hours as needed for nausea or vomiting. (Patient not taking: Reported on 01/13/2024) 30 tablet 0     Review of Systems  Constitutional:  Negative for chills and fever.  Respiratory:  Negative for cough.   Gastrointestinal:  Negative for abdominal pain, constipation, diarrhea, nausea and  vomiting.  Genitourinary:  Positive for vaginal bleeding. Negative for dysuria, genital sores and vaginal discharge.  Musculoskeletal:  Positive for back pain.   Physical Exam   Blood pressure (!) 98/53, pulse 85, temperature 98 F (36.7 C), temperature source Oral, resp. rate 14, height 5\' 1"  (1.549 m), weight 53.7 kg, last menstrual period 07/01/2023, SpO2 100%, unknown if currently breastfeeding.  Back Pain The current episode started yesterday. The problem has been gradually improving since onset. The pain is present in the sacro-iliac. The quality of the pain is described as cramping. The pain is at a severity of 3/10. The pain is mild. Pertinent negatives include no abdominal pain, dysuria or fever. She has tried heat for the symptoms. The treatment provided no relief.  Vaginal Bleeding The patient's primary symptoms include vaginal bleeding. The patient's pertinent negatives include no vaginal discharge. This is a new problem. The current episode started today. The problem has been resolved. The patient is experiencing no pain. Associated symptoms include back pain. Pertinent negatives include no abdominal pain, chills, constipation, diarrhea, dysuria, fever, nausea or vomiting.   Physical Exam Vitals and nursing note reviewed. Exam conducted with a chaperone present.  Constitutional:      General: She is not in acute distress.    Appearance: She is not ill-appearing.  Cardiovascular:     Rate and Rhythm: Normal rate.  Pulmonary:     Effort: Pulmonary effort is normal.  Abdominal:     Tenderness: There is no abdominal tenderness. There is no right CVA tenderness, left CVA tenderness, guarding or rebound.  Genitourinary:    General: Normal vulva.     Exam position: Lithotomy position.     Vagina: Vaginal discharge present.     Cervix: Dilated. Discharge and friability present. No cervical motion tenderness.     Rectum: Normal.     Comments: Scant blood noted to cervical surface,  none from os. Moderate normal appearing vaginal discharged noted coating cervix and in vaginal vault. Swabs collected. CE: 0.5/thick, posterior, mid-positioned Skin:    General: Skin is warm and dry.  Neurological:     Mental Status: She is alert and oriented to person, place, and time.  Psychiatric:        Mood and Affect: Mood normal.        Behavior: Behavior normal.    MAU Course  Procedures  Mode: EFM FHR: 140-150s  Variability: mod Accels: present Decels: none Scalp Stim: n/a Contractions: none   Duration: n/a  Quality: irritability  Resting tone & time: n/a  Orders Placed This Encounter  Procedures   Wet prep, genital   Urinalysis, Routine w reflex microscopic -Urine, Clean Catch      Results for orders placed or performed during the hospital encounter of 01/20/24 (from the past 24 hours)  Urinalysis, Routine w reflex microscopic -Urine, Clean Catch     Status: Abnormal   Collection Time: 01/20/24 11:14 AM  Result Value Ref Range   Color, Urine YELLOW YELLOW   APPearance HAZY (A) CLEAR   Specific Gravity, Urine 1.019 1.005 - 1.030   pH 6.0 5.0 - 8.0   Glucose, UA NEGATIVE NEGATIVE mg/dL   Hgb urine dipstick NEGATIVE NEGATIVE   Bilirubin Urine NEGATIVE NEGATIVE   Ketones, ur NEGATIVE NEGATIVE mg/dL   Protein, ur NEGATIVE NEGATIVE mg/dL   Nitrite NEGATIVE NEGATIVE   Leukocytes,Ua TRACE (A) NEGATIVE   RBC / HPF 0-5 0 - 5 RBC/hpf   WBC, UA 0-5 0 - 5 WBC/hpf   Bacteria, UA NONE SEEN NONE SEEN   Squamous Epithelial / HPF 6-10 0 - 5 /HPF   Mucus PRESENT   Wet prep, genital     Status: Abnormal   Collection Time: 01/20/24 12:24 PM   Specimen: PATH Cytology Cervicovaginal Ancillary Only  Result Value Ref Range   Yeast Wet Prep HPF POC NONE SEEN NONE SEEN   Trich, Wet Prep NONE SEEN NONE SEEN   Clue Cells Wet Prep HPF POC NONE SEEN NONE SEEN   WBC, Wet Prep HPF POC >=10 (A) <10   Sperm NONE SEEN      MDM PTL PPROM Vaginal infection, unspecified Bladder/kidney  infection, unspecified  Pad is noted to be clean and no further bleeding or clots since initial occurrence. NST reactive. No contractions on monitor. Some irritability.  Lower back pain has mostly resolved since initial occurrence. Previous ultrasounds reviewed with normal findings.   UA: low suspicion for acute infection CE: 0.5/thick, normal vaginal discharge, +friability, -CMT. Negative pooling. Low suspicion for PPROM/PTL at this time. Wet prep: +WBCs GC/CT: pending  Discharge home.  Assessment and Plan  Friable cervix - Educated -Expectant management reviewed -Questions encouraged and answered -Encourage patient to return if sx's persist or worsen -Follow-up with GC/CT as needed  Preterm labor symptoms and general obstetric precautions including but not limited to vaginal bleeding, contractions, leaking of fluid and fetal movement were reviewed in detail with the patient. Please refer to After Visit Summary for other counseling recommendations.   Discharge home in stable condition.  Darrell Jewel, SNM 01/20/2024, 1:07 PM

## 2024-01-20 NOTE — MAU Note (Signed)
...  Carly Singleton is a 28 y.o. at [redacted]w[redacted]d here in MAU reporting: She reports she felt a trickle of fluids around 0830 this morning and when she went to the restroom and wiped she noted light pink vaginal discharge with one dime sized blood clot. Denies vaginal itching and vaginal odors. Denies feeling any leaking since then. Nothing on her pad since applying it earlier this morning. +FM.   Denies complications with her placenta. Denies recent IC.  Onset of complaint: 0830 Pain score: 3/10 lower back - ongoing back pain in pregnancy  Vitals:   01/20/24 1109  BP: (!) 98/53  Pulse: 85  Resp: 14  Temp: 98 F (36.7 C)  SpO2: 100%     FHT: 135 initial external Lab orders placed from triage: UA

## 2024-02-04 ENCOUNTER — Encounter: Payer: Self-pay | Admitting: Obstetrics & Gynecology

## 2024-02-04 ENCOUNTER — Ambulatory Visit (INDEPENDENT_AMBULATORY_CARE_PROVIDER_SITE_OTHER): Payer: Medicaid Other | Admitting: Obstetrics & Gynecology

## 2024-02-04 ENCOUNTER — Other Ambulatory Visit: Payer: Medicaid Other

## 2024-02-04 VITALS — BP 99/62 | HR 76 | Wt 121.0 lb

## 2024-02-04 DIAGNOSIS — O99013 Anemia complicating pregnancy, third trimester: Secondary | ICD-10-CM

## 2024-02-04 DIAGNOSIS — Z3A28 28 weeks gestation of pregnancy: Secondary | ICD-10-CM | POA: Diagnosis not present

## 2024-02-04 DIAGNOSIS — Z3480 Encounter for supervision of other normal pregnancy, unspecified trimester: Secondary | ICD-10-CM

## 2024-02-04 NOTE — Patient Instructions (Signed)
 TDaP Vaccine Pregnancy Get the Whooping Cough Vaccine While You Are Pregnant (CDC)  It is important for women to get the whooping cough vaccine in the third trimester of each pregnancy. Vaccines are the best way to prevent this disease. There are 2 different whooping cough vaccines. Both vaccines combine protection against whooping cough, tetanus and diphtheria, but they are for different age groups: Tdap: for everyone 11 years or older, including pregnant women  DTaP: for children 2 months through 10 years of age  You need the whooping cough vaccine during each of your pregnancies The recommended time to get the shot is during your 27th through 36th week of pregnancy, preferably during the earlier part of this time period. The Centers for Disease Control and Prevention (CDC) recommends that pregnant women receive the whooping cough vaccine for adolescents and adults (called Tdap vaccine) during the third trimester of each pregnancy. The recommended time to get the shot is during your 27th through 36th week of pregnancy, preferably during the earlier part of this time period. This replaces the original recommendation that pregnant women get the vaccine only if they had not previously received it. The [employer] of [employer]ians and [employer]necologists and the [employer] support this recommendation.  You should get the whooping cough vaccine while pregnant to pass protection to your baby frame support disabled and/or not supported in this browser  Learn why Carly Singleton decided to get the whooping cough vaccine in her 3rd trimester of pregnancy and how her baby girl was born with some protection against the disease. Also available on YouTube. After receiving the whooping cough vaccine, your body will create protective antibodies (proteins produced by the body to fight off diseases) and pass some of them to your baby before birth. These antibodies provide your baby some short-term  protection against whooping cough in early life. These antibodies can also protect your baby from some of the more serious complications that come along with whooping cough. Your protective antibodies are at their highest about 2 weeks after getting the vaccine, but it takes time to pass them to your baby. So the preferred time to get the whooping cough vaccine is early in your third trimester. The amount of whooping cough antibodies in your body decreases over time. That is why CDC recommends you get a whooping cough vaccine during each pregnancy. Doing so allows each of your babies to get the greatest number of protective antibodies from you. This means each of your babies will get the best protection possible against this disease.  Getting the whooping cough vaccine while pregnant is better than getting the vaccine after you give birth Whooping cough vaccination during pregnancy is ideal so your baby will have short-term protection as soon as he is born. This early protection is important because your baby will not start getting his whooping cough vaccines until he is 2 months old. These first few months of life are when your baby is at greatest risk for catching whooping cough. This is also when he's at greatest risk for having severe, potentially life-threating complications from the infection. To avoid that gap in protection, it is best to get a whooping cough vaccine during pregnancy. You will then pass protection to your baby before he is born. To continue protecting your baby, he should get whooping cough vaccines starting at 2 months old. You may never have gotten the Tdap vaccine before and did not get it during this pregnancy. If so, you should make sure  to get the vaccine immediately after you give birth, before leaving the hospital or birthing center. It will take about 2 weeks before your body develops protection (antibodies) in response to the vaccine. Once you have protection from the vaccine,  you are less likely to give whooping cough to your newborn while caring for him. But remember, your baby will still be at risk for catching whooping cough from others. A recent study looked to see how effective Tdap was at preventing whooping cough in babies whose mothers got the vaccine while pregnant or in the hospital after giving birth. The study found that getting Tdap between 27 through 36 weeks of pregnancy is 85% more effective at preventing whooping cough in babies younger than 2 months old. Blood tests cannot tell if you need a whooping cough vaccine There are no blood tests that can tell you if you have enough antibodies in your body to protect yourself or your baby against whooping cough. Even if you have been sick with whooping cough in the past or previously received the vaccine, you still should get the vaccine during each pregnancy. Breastfeeding may pass some protective antibodies onto your baby By breastfeeding, you may pass some antibodies you have made in response to the vaccine to your baby. When you get a whooping cough vaccine during your pregnancy, you will have antibodies in your breast milk that you can share with your baby as soon as your milk comes in. However, your baby will not get protective antibodies immediately if you wait to get the whooping cough vaccine until after delivering your baby. This is because it takes about 2 weeks for your body to create antibodies. Learn more about the health benefits of breastfeeding.

## 2024-02-04 NOTE — Progress Notes (Signed)
   PRENATAL VISIT NOTE  Subjective:  Carly Singleton is a 28 y.o. [id number] at [redacted]w[redacted]d being seen today for ongoing prenatal care.  She is currently monitored for the following issues for this low-risk pregnancy and has Supervision of other normal pregnancy, antepartum; Down syndrome in child of prior pregnancy, currently pregnant; H/O herpes simplex infection; and Friable cervix on their problem list.  Patient reports no complaints.  Contractions: Irregular. Vag. Bleeding: None.  Movement: Present. Denies leaking of fluid.   The following portions of the patient's history were reviewed and updated as appropriate: allergies, current medications, past family history, past medical history, past social history, past surgical history and problem list.   Objective:   Vitals:   02/04/24 0856  BP: 99/62  Pulse: 76  Weight: 121 lb (54.9 kg)    Fetal Status: Fetal Heart Rate (bpm): 144 Fundal Height: 28 cm Movement: Present     General:  Alert, oriented and cooperative. Patient is in no acute distress.  Skin: Skin is warm and dry. No rash noted.   Cardiovascular: Normal heart rate noted  Respiratory: Normal respiratory effort, no problems with respiration noted  Abdomen: Soft, gravid, appropriate for gestational age.  Pain/Pressure: Present     Pelvic: Cervical exam deferred        Extremities: Normal range of motion.  Edema: None  Mental Status: Normal mood and affect. Normal behavior. Normal judgment and thought content.   Assessment and Plan:  Pregnancy: [id number] at [redacted]w[redacted]d 1. [redacted] weeks gestation of pregnancy 2. Supervision of other normal pregnancy, antepartum (Primary) Third trimester labs today, will follow up results and manage accordingly.  Tdap next visit. - Glucose Tolerance, 2 Hours w/1 Hour - CBC - RPR - HIV Antibody (routine testing w rflx) Preterm labor symptoms and general obstetric precautions including but not limited to vaginal bleeding, contractions, leaking of fluid  and fetal movement were reviewed in detail with the patient. Please refer to After Visit Summary for other counseling recommendations.   Return in about 2 weeks (around 02/18/2024) for OFFICE OB VISIT (MD only).  Future Appointments  Date Time Provider Department Center  02/17/2024  8:35 AM Gilford Lardizabal, Gloris LABOR, MD [facility]  03/02/2024  8:35 AM Izell Harari, MD [facility]  03/16/2024  8:35 AM Tyra Gural, Gloris LABOR, MD [facility]    Gloris Hugger, MD

## 2024-02-05 LAB — CBC
Hematocrit: 29.1 % — ABNORMAL LOW (ref 34.0–46.6)
Hemoglobin: 9.3 g/dL — ABNORMAL LOW (ref 11.1–15.9)
MCH: 27.7 pg (ref 26.6–33.0)
MCHC: 32 g/dL (ref 31.5–35.7)
MCV: 87 fL (ref 79–97)
Platelets: 472 10*3/uL — ABNORMAL HIGH (ref 150–450)
RBC: 3.36 x10E6/uL — ABNORMAL LOW (ref 3.77–5.28)
RDW: 13.4 % (ref 11.7–15.4)
WBC: 7.5 10*3/uL (ref 3.4–10.8)

## 2024-02-05 LAB — RPR: RPR Ser Ql: NONREACTIVE

## 2024-02-05 LAB — GLUCOSE TOLERANCE, 2 HOURS W/ 1HR
Glucose, 1 hour: 102 mg/dL (ref 70–179)
Glucose, 2 hour: 78 mg/dL (ref 70–152)
Glucose, Fasting: 77 mg/dL (ref 70–91)

## 2024-02-05 LAB — HIV ANTIBODY (ROUTINE TESTING W REFLEX): HIV Screen 4th Generation wRfx: NONREACTIVE

## 2024-02-07 ENCOUNTER — Encounter: Payer: Self-pay | Admitting: Obstetrics & Gynecology

## 2024-02-07 DIAGNOSIS — O99013 Anemia complicating pregnancy, third trimester: Secondary | ICD-10-CM | POA: Insufficient documentation

## 2024-02-07 MED ORDER — FERRIC MALTOL 30 MG PO CAPS: 1.0000 | ORAL_CAPSULE | Freq: Two times a day (BID) | ORAL | 2 refills | Status: DC

## 2024-02-07 NOTE — Addendum Note (Signed)
 Addended by: Lenoard Rad A on: 02/07/2024 08:19 AM   Modules accepted: Orders

## 2024-02-16 ENCOUNTER — Telehealth: Payer: Self-pay | Admitting: *Deleted

## 2024-02-16 NOTE — Telephone Encounter (Signed)
 Attempted to call pt, left message that we are opening at 10 am tomorrow due to the weather and wanted to see if she can come at 10am since she has an 8:35am appt.   Mychart message sent as well.

## 2024-02-17 ENCOUNTER — Encounter: Payer: Medicaid Other | Admitting: Obstetrics & Gynecology

## 2024-03-02 ENCOUNTER — Ambulatory Visit: Payer: Medicaid Other | Admitting: Obstetrics and Gynecology

## 2024-03-02 VITALS — BP 96/60 | HR 71 | Wt 123.0 lb

## 2024-03-02 DIAGNOSIS — Z3480 Encounter for supervision of other normal pregnancy, unspecified trimester: Secondary | ICD-10-CM

## 2024-03-02 DIAGNOSIS — O99013 Anemia complicating pregnancy, third trimester: Secondary | ICD-10-CM | POA: Diagnosis not present

## 2024-03-02 DIAGNOSIS — Z87898 Personal history of other specified conditions: Secondary | ICD-10-CM | POA: Diagnosis not present

## 2024-03-02 DIAGNOSIS — Z8619 Personal history of other infectious and parasitic diseases: Secondary | ICD-10-CM

## 2024-03-02 DIAGNOSIS — O09299 Supervision of pregnancy with other poor reproductive or obstetric history, unspecified trimester: Secondary | ICD-10-CM | POA: Diagnosis not present

## 2024-03-02 DIAGNOSIS — Z3A32 32 weeks gestation of pregnancy: Secondary | ICD-10-CM

## 2024-03-02 DIAGNOSIS — N888 Other specified noninflammatory disorders of cervix uteri: Secondary | ICD-10-CM

## 2024-03-02 HISTORY — DX: Personal history of other specified conditions: Z87.898

## 2024-03-02 NOTE — Progress Notes (Signed)
   PRENATAL VISIT NOTE  Subjective:  Carly Singleton is a 28 y.o. [id number] at [redacted]w[redacted]d being seen today for ongoing prenatal care.  She is currently monitored for the following issues for this low-risk pregnancy and has Supervision of other normal pregnancy, antepartum; Down syndrome in child of prior pregnancy, currently pregnant; H/O herpes simplex infection; Friable cervix; and Anemia in pregnancy, third trimester on their problem list.  Patient reports  feels vaginal pressure occasionally throughout the day .  Contractions: Irritability. Vag. Bleeding: None.  Movement: Present. Denies leaking of fluid.   The following portions of the patient's history were reviewed and updated as appropriate: allergies, current medications, past family history, past medical history, past social history, past surgical history and problem list.   Objective:   Vitals:   03/02/24 0901  BP: 96/60  Pulse: 71  Weight: 123 lb (55.8 kg)    Fetal Status: Fetal Heart Rate (bpm): 130   Movement: Present     General:  Alert, oriented and cooperative. Patient is in no acute distress.  Skin: Skin is warm and dry. No rash noted.   Cardiovascular: Normal heart rate noted  Respiratory: Normal respiratory effort, no problems with respiration noted  Abdomen: Soft, gravid, appropriate for gestational age.  Pain/Pressure: Present     Pelvic: Cervical exam performed in the presence of a chaperone Dilation: Closed Effacement (%): Thick Station: Ballotable  Extremities: Normal range of motion.  Edema: None  Mental Status: Normal mood and affect. Normal behavior. Normal judgment and thought content.   Assessment and Plan:  Pregnancy: [id number] at [redacted]w[redacted]d 1. [redacted] weeks gestation of pregnancy (Primary) Routine care. PTL precautions given.  Only 11lbs weight gain, continue to follow and encourage healthy eating  2. Anemia in pregnancy, third trimester Confirms on PO iron. Repeat CBC and ferritin next visit    Latest Ref  Rng & Units 02/04/2024    9:10 AM 10/11/2023    4:00 PM 09/06/2023    3:59 PM  CBC  WBC 3.4 - 10.8 x10E3/uL 7.5  10.4  10.0   Hemoglobin 11.1 - 15.9 g/dL 9.3  45.4  09.8   Hematocrit 34.0 - 46.6 % 29.1  37.1  36.1   Platelets 150 - 450 x10E3/uL 472  493  431    3. Friable cervix Negative on cx exam today  4. H/O herpes simplex infection Recommend ppx valtrex at 35-36wks  5. Down syndrome in child of prior pregnancy, currently pregnant Low risk panorama this pregnancy  6. History of FGR in prior pregnancy Will get 32-34wk surveillance growth u/s.   Preterm labor symptoms and general obstetric precautions including but not limited to vaginal bleeding, contractions, leaking of fluid and fetal movement were reviewed in detail with the patient. Please refer to After Visit Summary for other counseling recommendations.   Return in about 2 weeks (around 03/16/2024) for low risk ob, in person, md or app.  Future Appointments  Date Time Provider Department Center  03/16/2024  8:35 AM Anyanwu, Jethro Bastos, MD [facility]  03/31/2024  9:35 AM Anyanwu, Jethro Bastos, MD [facility]  04/07/2024  9:35 AM Reva Bores, MD [facility]  04/13/2024  8:55 AM Macon Large, Jethro Bastos, MD [facility]  04/21/2024  9:35 AM Federico Flake, MD [facility]    [place] Bing, MD

## 2024-03-02 NOTE — Progress Notes (Signed)
 ROB   CC: lots of vaginal pressure notes being hard to walk. Would like cervix check

## 2024-03-16 ENCOUNTER — Ambulatory Visit: Payer: Medicaid Other | Admitting: Obstetrics & Gynecology

## 2024-03-16 VITALS — BP 104/65 | HR 105 | Wt 127.0 lb

## 2024-03-16 DIAGNOSIS — Z3A34 34 weeks gestation of pregnancy: Secondary | ICD-10-CM

## 2024-03-16 DIAGNOSIS — Z87898 Personal history of other specified conditions: Secondary | ICD-10-CM

## 2024-03-16 DIAGNOSIS — O99013 Anemia complicating pregnancy, third trimester: Secondary | ICD-10-CM | POA: Diagnosis not present

## 2024-03-16 DIAGNOSIS — Z3480 Encounter for supervision of other normal pregnancy, unspecified trimester: Secondary | ICD-10-CM | POA: Diagnosis not present

## 2024-03-16 NOTE — Progress Notes (Signed)
 PRENATAL VISIT NOTE  Subjective:  Carly Singleton is a 28 y.o. [id number] at [redacted]w[redacted]d being seen today for ongoing prenatal care.  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  Patient reports no significant complaints.  Contractions: Irritability. Vag. Bleeding: None.  Movement: Present. Denies leaking of fluid.   The following portions of the patient's history were reviewed and updated as appropriate: allergies, current medications, past family history, past medical history, past social history, past surgical history and problem list.   Of note, permission was obtained from patient to have a medical student present during this encounter and to participate in the physical examination.  Objective:   Vitals:   03/16/24 0914  BP: 104/65  Pulse: (!) 105  Weight: 127 lb (57.6 kg)    Fetal Status: Fetal Heart Rate (bpm): 132 Fundal Height: 33 cm Movement: Present     General:  Alert, oriented and cooperative. Patient is in no acute distress.  Skin: Skin is warm and dry. No rash noted.   Cardiovascular: Normal heart rate noted  Respiratory: Normal respiratory effort, no problems with respiration noted  Abdomen: Soft, gravid, appropriate for gestational age.  Pain/Pressure: Present     Pelvic: Cervical exam deferred        Extremities: Normal range of motion.  Edema: None  Mental Status: Normal mood and affect. Normal behavior. Normal judgment and thought content.   Assessment and Plan:  Pregnancy: [id number] at [redacted]w[redacted]d 1. History of poor fetal growth (Primary) Patient is scheduled for 34 week surveillance ultrasound. Will follow up with her pending results.  2. Anemia in pregnancy, third  trimester She will continue on her po iron tablets; we will follow up with her after her CBC and ferritin to assess the efficacy of her iron supplementation. - CBC - Ferritin  3. [redacted] weeks gestation of pregnancy 4. Supervision of other normal pregnancy, antepartum Continue routine prenatal care. She will follow up at her next scheduled appointment in 2 weeks. Preterm labor symptoms and general obstetric precautions including but not limited to vaginal bleeding, contractions, leaking of fluid and fetal movement were reviewed in detail with the patient.  Please refer to After Visit Summary for other counseling recommendations.   Return in 2 weeks (on 03/30/2024) for Pelvic cultures, OFFICE OB VISIT (MD or APP).  Future Appointments  Date Time Provider Department Center  03/31/2024  9:35 AM Tereso Newcomer, MD [facility]  04/05/2024 10:15 AM [facility] NURSE [facility]  04/05/2024 10:30 AM [facility] US4 [facility]  04/07/2024  9:35 AM Reva Bores, MD [facility]  04/13/2024  8:55 AM Ancelmo Hunt, Jethro Bastos, MD [facility]  04/21/2024  9:35 AM Federico Flake, MD [facility]    Elwin Mocha, Medical Student    Attestation of Attending Supervision of Student:  I confirm that I have verified the information documented in the medical student's note and that I have also personally co-performed the history, physical exam and all medical decision making activities.  I have verified that all services and findings are accurately documented in this student's note; and I agree with management and plan as outlined in the documentation. I have also made any necessary editorial changes.   Jaynie Collins, MD, FACOG Attending Obstetrician &  Gynecologist, Biochemist, clinical for [employer], [facility]

## 2024-03-16 NOTE — Patient Instructions (Signed)

## 2024-03-16 NOTE — Progress Notes (Signed)
 ROB   CC: None  Pt Consents to female student in exam rm.

## 2024-03-31 ENCOUNTER — Other Ambulatory Visit (HOSPITAL_COMMUNITY)
Admission: RE | Admit: 2024-03-31 | Discharge: 2024-03-31 | Disposition: A | Discharge status: Home / Self Care | Source: Ambulatory Visit | Attending: Obstetrics & Gynecology | Admitting: Obstetrics & Gynecology

## 2024-03-31 ENCOUNTER — Encounter: Payer: Self-pay | Admitting: Obstetrics & Gynecology

## 2024-03-31 ENCOUNTER — Ambulatory Visit (INDEPENDENT_AMBULATORY_CARE_PROVIDER_SITE_OTHER): Payer: Medicaid Other | Admitting: Obstetrics & Gynecology

## 2024-03-31 VITALS — BP 97/62 | HR 64 | Wt 128.0 lb

## 2024-03-31 DIAGNOSIS — Z87898 Personal history of other specified conditions: Secondary | ICD-10-CM

## 2024-03-31 DIAGNOSIS — Z3A36 36 weeks gestation of pregnancy: Secondary | ICD-10-CM

## 2024-03-31 DIAGNOSIS — Z8619 Personal history of other infectious and parasitic diseases: Secondary | ICD-10-CM | POA: Diagnosis not present

## 2024-03-31 DIAGNOSIS — Z23 Encounter for immunization: Secondary | ICD-10-CM

## 2024-03-31 DIAGNOSIS — Z3483 Encounter for supervision of other normal pregnancy, third trimester: Secondary | ICD-10-CM

## 2024-03-31 DIAGNOSIS — Z3480 Encounter for supervision of other normal pregnancy, unspecified trimester: Secondary | ICD-10-CM | POA: Diagnosis not present

## 2024-03-31 DIAGNOSIS — Z1339 Encounter for screening examination for other mental health and behavioral disorders: Secondary | ICD-10-CM

## 2024-03-31 MED ORDER — VALACYCLOVIR HCL 1 G PO TABS: 1000.0000 mg | ORAL_TABLET | Freq: Every day | ORAL | 1 refills | Status: AC

## 2024-03-31 NOTE — Progress Notes (Signed)
   PRENATAL VISIT NOTE  Subjective:  Carly Singleton is a 28 y.o. [id number] at [redacted]w[redacted]d being seen today for ongoing prenatal care.  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  Patient reports no complaints.  Contractions: Irritability. Vag. Bleeding: None.  Movement: Present. Denies leaking of fluid.   The following portions of the patient's history were reviewed and updated as appropriate: allergies, current medications, past family history, past medical history, past social history, past surgical history and problem list.   Objective:   Vitals:   03/31/24 0936  BP: 97/62  Pulse: 64  Weight: 128 lb (58.1 kg)    Fetal Status: Fetal Heart Rate (bpm): 140 Fundal Height: 36 cm Movement: Present  Presentation: Vertex  General:  Alert, oriented and cooperative. Patient is in no acute distress.  Skin: Skin is warm and dry. No rash noted.   Cardiovascular: Normal heart rate noted  Respiratory: Normal respiratory effort, no problems with respiration noted  Abdomen: Soft, gravid, appropriate for gestational age.  Pain/Pressure: Present     Pelvic: Cervical exam performed in the presence of a chaperone Dilation: 1 Effacement (%): Thick Station: -3, cultures obtained  Extremities: Normal range of motion.  Edema: None  Mental Status: Normal mood and affect. Normal behavior. Normal judgment and thought content.   Assessment and Plan:  Pregnancy: [id number] at [redacted]w[redacted]d 1. History of poor fetal growth in prior pregnancy Fundal height reassuring, growth scan scheduled on 04/05/24.    2. H/O herpes simplex infection Only had positive blood tests, however Valtrex recommended. This was  prescribed - valACYclovir (VALTREX) 1000 MG tablet; Take 1 tablet (1,000 mg total) by mouth daily.  Dispense: 30 tablet; Refill: 1  3. Need for diphtheria-tetanus-pertussis (Tdap) vaccine - Tdap vaccine greater than or equal to 7yo IM given today  4. [redacted] weeks gestation of pregnancy 5. Supervision of other normal pregnancy, antepartum (Primary) Pelvic cultures done, will follow up results and manage accordingly. - Strep Gp B Culture+Rflx - GC/Chlamydia probe amp ()not at [facility] Preterm labor symptoms and general obstetric precautions including but not limited to vaginal bleeding, contractions, leaking of fluid and fetal movement were reviewed in detail with the patient. Please refer to After Visit Summary for other counseling recommendations.   Return in about 1 week (around 04/07/2024) for OFFICE OB VISIT (MD only).  Future Appointments  Date Time Provider Department Center  04/05/2024 10:15 AM [facility] NURSE [facility]  04/05/2024 10:30 AM [facility] US4 [facility]  04/07/2024  9:35 AM Reva Bores, MD [facility]  04/13/2024  8:55 AM Lerin Jech, Jethro Bastos, MD [facility]  04/21/2024  9:35 AM Federico Flake, MD [facility]    Jaynie Collins, MD

## 2024-03-31 NOTE — Patient Instructions (Signed)

## 2024-04-03 ENCOUNTER — Encounter: Payer: Self-pay | Admitting: Obstetrics & Gynecology

## 2024-04-03 LAB — STREP GP B CULTURE+RFLX: Strep Gp B Culture+Rflx: NEGATIVE

## 2024-04-03 LAB — GC/CHLAMYDIA PROBE AMP (~~LOC~~) NOT AT ARMC
Chlamydia: NEGATIVE
Comment: NEGATIVE
Comment: NORMAL
Neisseria Gonorrhea: NEGATIVE

## 2024-04-04 ENCOUNTER — Encounter: Payer: Self-pay | Admitting: Obstetrics & Gynecology

## 2024-04-05 ENCOUNTER — Ambulatory Visit

## 2024-04-05 ENCOUNTER — Ambulatory Visit (HOSPITAL_BASED_OUTPATIENT_CLINIC_OR_DEPARTMENT_OTHER): Admitting: Obstetrics and Gynecology

## 2024-04-05 ENCOUNTER — Ambulatory Visit: Attending: Obstetrics and Gynecology

## 2024-04-05 VITALS — BP 104/51 | HR 71

## 2024-04-05 DIAGNOSIS — Z87898 Personal history of other specified conditions: Secondary | ICD-10-CM | POA: Diagnosis not present

## 2024-04-05 DIAGNOSIS — O09293 Supervision of pregnancy with other poor reproductive or obstetric history, third trimester: Secondary | ICD-10-CM | POA: Insufficient documentation

## 2024-04-05 DIAGNOSIS — N888 Other specified noninflammatory disorders of cervix uteri: Secondary | ICD-10-CM | POA: Insufficient documentation

## 2024-04-05 DIAGNOSIS — O3663X0 Maternal care for excessive fetal growth, third trimester, not applicable or unspecified: Secondary | ICD-10-CM

## 2024-04-05 DIAGNOSIS — Z362 Encounter for other antenatal screening follow-up: Secondary | ICD-10-CM | POA: Insufficient documentation

## 2024-04-05 DIAGNOSIS — Z3A37 37 weeks gestation of pregnancy: Secondary | ICD-10-CM | POA: Insufficient documentation

## 2024-04-05 DIAGNOSIS — Z3A32 32 weeks gestation of pregnancy: Secondary | ICD-10-CM

## 2024-04-05 NOTE — Progress Notes (Signed)
 Maternal-Fetal Medicine Consultation Name: Carly Singleton, Carly Singleton MRN: [id number]  G3 P2002 at 37-weeks' gestation.  History of fetal growth restriction.  Patient is here for fetal growth assessment. Obstetric history is significant for 2 term vaginal deliveries.  Her second child has Down syndrome and does not have cardiac anomalies. In this pregnancy, on cell-free fetal DNA screening, the risk for Down syndrome is not increased.  Ultrasound On today's ultrasound, the estimated fetal weight is at the 93rd percentile and the abdominal circumference measurement is at the 99th percentile.  Cephalic presentation.  Amniotic fluid is normal and good fetal activity seen.  I counseled the patient on the limitations of ultrasound and accurately estimating fetal weights.  I reassured her that vaginal delivery can be safely attempted.  Induction of labor is not recommended before 39 weeks of gestation for macrosomia alone.  Patient has a slightly increased risk for Down syndrome and subsequent pregnancies.  I have reassured her of greater detection of cell free fetal DNA screening in detecting Down syndrome.  No follow-up appointments were made.  Consultation including face-to-face (more than 50%) counseling 10 minutes.

## 2024-04-06 ENCOUNTER — Encounter: Payer: Self-pay | Admitting: Obstetrics and Gynecology

## 2024-04-06 DIAGNOSIS — O3660X0 Maternal care for excessive fetal growth, unspecified trimester, not applicable or unspecified: Secondary | ICD-10-CM | POA: Insufficient documentation

## 2024-04-06 HISTORY — DX: Maternal care for excessive fetal growth, unspecified trimester, not applicable or unspecified: O36.60X0

## 2024-04-07 ENCOUNTER — Ambulatory Visit (INDEPENDENT_AMBULATORY_CARE_PROVIDER_SITE_OTHER): Payer: Medicaid Other | Admitting: Family Medicine

## 2024-04-07 VITALS — BP 108/67 | HR 74

## 2024-04-07 DIAGNOSIS — O3663X0 Maternal care for excessive fetal growth, third trimester, not applicable or unspecified: Secondary | ICD-10-CM

## 2024-04-07 DIAGNOSIS — Z3A37 37 weeks gestation of pregnancy: Secondary | ICD-10-CM

## 2024-04-07 DIAGNOSIS — Z3480 Encounter for supervision of other normal pregnancy, unspecified trimester: Secondary | ICD-10-CM | POA: Diagnosis not present

## 2024-04-07 NOTE — Progress Notes (Signed)
    PRENATAL VISIT NOTE  Subjective:  Carly Singleton is a 28 y.o. [id number] at [redacted]w[redacted]d being seen today for ongoing prenatal care.  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  Patient reports contractions since last few days .  Contractions: Irregular. Vag. Bleeding: None.  Movement: Present. Denies leaking of fluid.   The following portions of the patient's history were reviewed and updated as appropriate: allergies, current medications, past family history, past medical history, past social history, past surgical history and problem list.   Objective:   Vitals:   04/07/24 0944  BP: 108/67  Pulse: 74    Fetal Status: Fetal Heart Rate (bpm): 148 Fundal Height: 35 cm Movement: Present  Presentation: Vertex  General:  Alert, oriented and cooperative. Patient is in no acute distress.  Skin: Skin is warm and dry. No rash noted.   Cardiovascular: Normal heart rate noted  Respiratory: Normal respiratory effort, no problems with respiration noted  Abdomen: Soft, gravid, appropriate for gestational age.  Pain/Pressure: Present     Pelvic: Cervical exam performed in the presence of a chaperone Dilation: 2 Effacement (%): Thick Station: -3  Extremities: Normal range of motion.  Edema: Trace  Mental Status: Normal mood and affect. Normal behavior. Normal judgment and thought content.   Assessment and Plan:  Pregnancy: [id number] at [redacted]w[redacted]d 1. Supervision of other normal pregnancy, antepartum (Primary) Continue routine prenatal care.  2. Excessive fetal growth affecting management of pregnancy in third trimester,  single or unspecified fetus For IOL @ 39 weeks  3. [redacted] weeks gestation of pregnancy   Term labor symptoms and general obstetric precautions including but not limited to vaginal bleeding, contractions, leaking of fluid and fetal movement were reviewed in detail with the patient. Please refer to After Visit Summary for other counseling recommendations.   Return in 1 week (on 04/14/2024).  Future Appointments  Date Time Provider Department Center  04/13/2024  8:55 AM Anyanwu, Jethro Bastos, MD [facility]  04/20/2024  7:00 AM [facility] SCHED ROOM [facility] None  04/21/2024  9:35 AM Federico Flake, MD [facility]    Reva Bores, MD

## 2024-04-07 NOTE — Progress Notes (Signed)
 ROB   CC: pain and pressure irregular contractions  Cervix check

## 2024-04-11 ENCOUNTER — Encounter (HOSPITAL_COMMUNITY): Payer: Self-pay

## 2024-04-11 ENCOUNTER — Telehealth (HOSPITAL_COMMUNITY): Payer: Self-pay | Admitting: *Deleted

## 2024-04-11 NOTE — Telephone Encounter (Signed)
 Preadmission screen

## 2024-04-12 ENCOUNTER — Encounter (HOSPITAL_COMMUNITY): Payer: Self-pay | Admitting: *Deleted

## 2024-04-12 ENCOUNTER — Telehealth (HOSPITAL_COMMUNITY): Payer: Self-pay | Admitting: *Deleted

## 2024-04-12 NOTE — Telephone Encounter (Signed)
 Preadmission screen

## 2024-04-13 ENCOUNTER — Ambulatory Visit (INDEPENDENT_AMBULATORY_CARE_PROVIDER_SITE_OTHER): Payer: Medicaid Other | Admitting: Obstetrics & Gynecology

## 2024-04-13 VITALS — BP 100/64 | HR 73

## 2024-04-13 DIAGNOSIS — Z3480 Encounter for supervision of other normal pregnancy, unspecified trimester: Secondary | ICD-10-CM | POA: Diagnosis not present

## 2024-04-13 DIAGNOSIS — O3663X0 Maternal care for excessive fetal growth, third trimester, not applicable or unspecified: Secondary | ICD-10-CM | POA: Diagnosis not present

## 2024-04-13 DIAGNOSIS — Z8619 Personal history of other infectious and parasitic diseases: Secondary | ICD-10-CM

## 2024-04-13 DIAGNOSIS — Z3A38 38 weeks gestation of pregnancy: Secondary | ICD-10-CM | POA: Diagnosis not present

## 2024-04-13 NOTE — Progress Notes (Signed)
 PRENATAL VISIT NOTE  Subjective:  Carly Singleton is a 28 y.o. [id number] at [redacted]w[redacted]d being seen today for ongoing prenatal care.  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  Patient reports no complaints.  Contractions: Irritability. Vag. Bleeding: None.  Movement: Present. Denies leaking of fluid.   The following portions of the patient's history were reviewed and updated as appropriate: allergies, current medications, past family history, past medical history, past social history, past surgical history and problem list.   Objective:   Vitals:   04/13/24 0914  BP: 100/64  Pulse: 73    Fetal Status: Fetal Heart Rate (bpm): 144   Movement: Present  Presentation: Vertex  General:  Alert, oriented and cooperative. Patient is in no acute distress.  Skin: Skin is warm and dry. No rash noted.   Cardiovascular: Normal heart rate noted  Respiratory: Normal respiratory effort, no problems with respiration noted  Abdomen: Soft, gravid, appropriate for gestational age.  Pain/Pressure: Present     Pelvic: Cervical exam performed in the presence of a chaperone Dilation: 3 Effacement (%): 60 Station: -3  Extremities: Normal range of motion.  Edema: Trace  Mental Status: Normal mood and affect. Normal behavior. Normal judgment and thought content.   [place] MFM OB FOLLOW UP Result Date: 04/05/2024 ----------------------------------------------------------------------  OBSTETRICS REPORT                       (Signed Final 04/05/2024 11:34 am) ---------------------------------------------------------------------- Patient  Info  ID #:       [id number]                          D.O.B.:  1996/11/28 (27 yrs)(F)  Name:       Carly American-               Visit Date: 04/05/2024 10:38 am              CRUZ ---------------------------------------------------------------------- Performed By  Attending:        Noralee Space MD        Ref. Address:     [address]                                                             [place], [place]                                                             [address]  Performed By:     Anabel Halon          [facility]  Referred By:      [facility] ---------------------------------------------------------------------- Orders  #  Description                           Code        Ordered By  1  [place] MFM OB FOLLOW UP                   [phone]    CHARLIE PICKENS ----------------------------------------------------------------------  #  Order #                     Accession #                Episode #  1  [id number]                   [id number]                 [id number] ---------------------------------------------------------------------- Indications  Maternal care for excessive fetal growth,      O36.63X0  third trimester, fetus unspecified  Poor obstetrical history (previous pregnancy   O09.299  with T21)  Poor obstetric history: Previous fetal growth  O09.299  restriction (FGR)  [redacted] weeks gestation of pregnancy                Z3A.37  Encounter for other antenatal screening        Z36.2  follow-up  Low risk NIPS Neg AFP NEF [facility] ---------------------------------------------------------------------- Fetal Evaluation  Num Of Fetuses:         1  Fetal Heart Rate(bpm):  137  Cardiac Activity:       Observed  Presentation:           Cephalic   Placenta:               Posterior Fundal  P. Cord Insertion:      Previously seen  Amniotic Fluid  AFI FV:      Within normal limits  AFI Sum(cm)     %Tile       Largest Pocket(cm)  11.8            37          5.19  RUQ(cm)       RLQ(cm)       LUQ(cm)        LLQ(cm)  0             5.19          3.4            3.21 ---------------------------------------------------------------------- Biometry  BPD:  90.2  mm     G. Age:  36w 4d         53  %    CI:        73.08   %    70 - 86                                                          FL/HC:      20.7   %    20.8 - 22.6  HC:      335.4  mm     G. Age:  38w 3d         58  %    HC/AC:      0.92        0.92 - 1.05  AC:      365.7  mm     G. Age:  40w 3d       > 99  %    FL/BPD:     77.1   %    71 - 87  FL:       69.5  mm     G. Age:  35w 5d         17  %    FL/AC:      19.0   %    20 - 24  Est. FW:    3583  gm    7 lb 14 oz      93  % ---------------------------------------------------------------------- Gestational Age  U/S Today:     37w 6d                                        EDD:   04/20/24  Best:          37w 0d     Det. By:  U/S C R L  (09/09/23)    EDD:   04/26/24 ---------------------------------------------------------------------- Anatomy  Diaphragm:             Appears normal         Kidneys:                Appear normal  Stomach:               Appears normal, left   Bladder:                Appears normal                         sided ---------------------------------------------------------------------- Impression  G3 P2002 at 37-weeks' gestation.  History of fetal growth  restriction.  Patient is here for fetal growth assessment.  Obstetric history is significant for 2 term vaginal deliveries.  Her second child has Down syndrome and does not have  cardiac anomalies.  In this pregnancy, on cell-free fetal DNA screening, the risk  for Down syndrome is not increased.  Ultrasound  On today's ultrasound, the estimated fetal weight is at the  93rd percentile  and the abdominal circumference  measurement is at the 99th percentile.  Cephalic  presentation.  Amniotic fluid is normal and good fetal activity  seen.  I counseled the patient on the limitations  of ultrasound and  accurately estimating fetal weights.  I reassured her that  vaginal delivery can be safely attempted.  Induction of labor is  not recommended before 39 weeks of gestation for  macrosomia alone.  Patient has a slightly increased risk for Down syndrome and  subsequent pregnancies.  I have reassured her of greater  detection of cell free fetal DNA screening in detecting Down  syndrome. ---------------------------------------------------------------------- Recommendations  No follow-up appointments were made. ----------------------------------------------------------------------                 Cassandria Clever, MD Electronically Signed Final Report   04/05/2024 11:34 am ----------------------------------------------------------------------    Assessment and Plan:  Pregnancy: [id number] at [redacted]w[redacted]d 1. Excessive fetal growth affecting management of pregnancy in third trimester, single or unspecified fetus (Primary) IOL scheduled at 39 weeks.  2. H/O herpes simplex infection On Valtrex  3. [redacted] weeks gestation of pregnancy 4. Supervision of other normal pregnancy, antepartum Labor symptoms and general obstetric precautions including but not limited to vaginal bleeding, contractions, leaking of fluid and fetal movement were reviewed in detail with the patient. Please refer to After Visit Summary for other counseling recommendations.   Return for Postpartum check.  Future Appointments  Date Time Provider Department Center  04/20/2024  7:00 AM [facility] SCHED ROOM [facility] None    Lenoard Rad, MD

## 2024-04-13 NOTE — Patient Instructions (Addendum)
 Return to office for any scheduled appointments. Call the office or go to the MAU at [facility] if: You begin to have strong, frequent contractions Your water breaks.  Sometimes it is a big gush of fluid, sometimes it is just a trickle that keeps getting your underwear wet or running down your legs You have vaginal bleeding.  It is normal to have a small amount of spotting if your cervix was checked.  You do not feel your baby moving like normal.  If you do not, get something to eat and drink and lay down and focus on feeling your baby move.   If your baby is still not moving like normal, you should call the office or go to MAU. Any other obstetric concerns.    Things to Try After 37 weeks for Cervical Ripening (to get your cervix ready for labor) : May try one or all:    Try the [employer] at [email or web address].[email or web address] daily to improve baby's position and encourage the onset of labor.  Cervical Ripening: May try one or both Red Raspberry Leaf capsules or tea:  two 300mg  or 400mg  tablets with each meal, 2-3 times a day, or 1-3 cups of tea daily  Potential Side Effects Of Raspberry Leaf:  Most women do not experience any side effects from drinking raspberry leaf tea. However, nausea and loose stools are possible   Evening Primrose Oil capsules: take 1 capsule by mouth and place one capsule in the vagina every night.    Some of the potential side effects:  Upset stomach  Loose stools or diarrhea  Headaches  Nausea    3.  6 Dates a day (may taste better if warmed in microwave until soft). Found where raisins are in the grocery store

## 2024-04-20 ENCOUNTER — Inpatient Hospital Stay (HOSPITAL_COMMUNITY)

## 2024-04-20 ENCOUNTER — Inpatient Hospital Stay (HOSPITAL_COMMUNITY)
Admission: AD | Admit: 2024-04-20 | Discharge: 2024-04-22 | DRG: 806 | Disposition: A | Discharge status: Home / Self Care | Attending: Obstetrics and Gynecology | Admitting: Obstetrics and Gynecology

## 2024-04-20 ENCOUNTER — Encounter (HOSPITAL_COMMUNITY): Payer: Self-pay | Admitting: Family Medicine

## 2024-04-20 ENCOUNTER — Inpatient Hospital Stay (HOSPITAL_COMMUNITY): Admitting: Anesthesiology

## 2024-04-20 ENCOUNTER — Other Ambulatory Visit: Payer: Self-pay

## 2024-04-20 DIAGNOSIS — O3663X0 Maternal care for excessive fetal growth, third trimester, not applicable or unspecified: Secondary | ICD-10-CM | POA: Diagnosis not present

## 2024-04-20 DIAGNOSIS — Z3A39 39 weeks gestation of pregnancy: Secondary | ICD-10-CM | POA: Diagnosis not present

## 2024-04-20 DIAGNOSIS — O9902 Anemia complicating childbirth: Secondary | ICD-10-CM | POA: Diagnosis present

## 2024-04-20 DIAGNOSIS — A6000 Herpesviral infection of urogenital system, unspecified: Secondary | ICD-10-CM | POA: Diagnosis not present

## 2024-04-20 DIAGNOSIS — O9832 Other infections with a predominantly sexual mode of transmission complicating childbirth: Secondary | ICD-10-CM | POA: Diagnosis present

## 2024-04-20 DIAGNOSIS — O134 Gestational [pregnancy-induced] hypertension without significant proteinuria, complicating childbirth: Secondary | ICD-10-CM | POA: Diagnosis not present

## 2024-04-20 DIAGNOSIS — N888 Other specified noninflammatory disorders of cervix uteri: Principal | ICD-10-CM

## 2024-04-20 DIAGNOSIS — O3660X0 Maternal care for excessive fetal growth, unspecified trimester, not applicable or unspecified: Secondary | ICD-10-CM | POA: Diagnosis present

## 2024-04-20 DIAGNOSIS — Z880 Allergy status to penicillin: Secondary | ICD-10-CM

## 2024-04-20 DIAGNOSIS — O99013 Anemia complicating pregnancy, third trimester: Secondary | ICD-10-CM | POA: Diagnosis present

## 2024-04-20 DIAGNOSIS — O09299 Supervision of pregnancy with other poor reproductive or obstetric history, unspecified trimester: Secondary | ICD-10-CM

## 2024-04-20 DIAGNOSIS — Z8619 Personal history of other infectious and parasitic diseases: Secondary | ICD-10-CM | POA: Diagnosis present

## 2024-04-20 DIAGNOSIS — Z3480 Encounter for supervision of other normal pregnancy, unspecified trimester: Secondary | ICD-10-CM

## 2024-04-20 LAB — CBC
HCT: 30.5 % — ABNORMAL LOW (ref 36.0–46.0)
Hemoglobin: 9.8 g/dL — ABNORMAL LOW (ref 12.0–15.0)
MCH: 25.5 pg — ABNORMAL LOW (ref 26.0–34.0)
MCHC: 32.1 g/dL (ref 30.0–36.0)
MCV: 79.2 fL — ABNORMAL LOW (ref 80.0–100.0)
Platelets: 478 10*3/uL — ABNORMAL HIGH (ref 150–400)
RBC: 3.85 MIL/uL — ABNORMAL LOW (ref 3.87–5.11)
RDW: 15.9 % — ABNORMAL HIGH (ref 11.5–15.5)
WBC: 8.4 10*3/uL (ref 4.0–10.5)
nRBC: 0.5 % — ABNORMAL HIGH (ref 0.0–0.2)

## 2024-04-20 LAB — TYPE AND SCREEN
ABO/RH(D): O POS
Antibody Screen: NEGATIVE

## 2024-04-20 MED ORDER — FENTANYL CITRATE (PF) 100 MCG/2ML IJ SOLN: 50.0000 ug | INTRAMUSCULAR | Status: DC | PRN

## 2024-04-20 MED ORDER — TERBUTALINE SULFATE 1 MG/ML IJ SOLN: 0.2500 mg | Freq: Once | INTRAMUSCULAR | Status: DC | PRN

## 2024-04-20 MED ORDER — LACTATED RINGERS IV SOLN: INTRAVENOUS | Status: DC

## 2024-04-20 MED ORDER — ONDANSETRON HCL 4 MG/2ML IJ SOLN: 4.0000 mg | Freq: Four times a day (QID) | INTRAMUSCULAR | Status: DC | PRN

## 2024-04-20 MED ORDER — EPHEDRINE 5 MG/ML INJ: 10.0000 mg | INTRAVENOUS | Status: DC | PRN

## 2024-04-20 MED ORDER — MISOPROSTOL 50MCG HALF TABLET: 50.0000 ug | ORAL_TABLET | Freq: Once | ORAL | Status: DC

## 2024-04-20 MED ORDER — DIPHENHYDRAMINE HCL 50 MG/ML IJ SOLN: 12.5000 mg | INTRAMUSCULAR | Status: DC | PRN

## 2024-04-20 MED ORDER — LACTATED RINGERS IV SOLN
500.0000 mL | Freq: Once | INTRAVENOUS | Status: DC
Start: 2024-04-20 — End: 2024-04-20

## 2024-04-20 MED ORDER — MISOPROSTOL 25 MCG QUARTER TABLET: 25.0000 ug | ORAL_TABLET | Freq: Once | ORAL | Status: DC

## 2024-04-20 MED ORDER — SOD CITRATE-CITRIC ACID 500-334 MG/5ML PO SOLN
30.0000 mL | ORAL | Status: DC | PRN
Start: 2024-04-20 — End: 2024-04-21

## 2024-04-20 MED ORDER — LACTATED RINGERS IV SOLN: 500.0000 mL | INTRAVENOUS | Status: DC | PRN

## 2024-04-20 MED ORDER — PHENYLEPHRINE 80 MCG/ML (10ML) SYRINGE FOR IV PUSH (FOR BLOOD PRESSURE SUPPORT): 80.0000 ug | PREFILLED_SYRINGE | INTRAVENOUS | Status: DC | PRN

## 2024-04-20 MED ORDER — FENTANYL-BUPIVACAINE-NACL 0.5-0.125-0.9 MG/250ML-% EP SOLN
12.0000 mL/h | EPIDURAL | Status: DC | PRN
  Administered 2024-04-20: 12 mL/h via EPIDURAL
  Filled 2024-04-20: qty 250

## 2024-04-20 MED ORDER — OXYTOCIN-SODIUM CHLORIDE 30-0.9 UT/500ML-% IV SOLN
1.0000 m[IU]/min | INTRAVENOUS | Status: DC
  Administered 2024-04-20: 2 m[IU]/min via INTRAVENOUS

## 2024-04-20 MED ORDER — FLEET ENEMA RE ENEM: 1.0000 | ENEMA | RECTAL | Status: DC | PRN

## 2024-04-20 MED ORDER — LACTATED RINGERS IV SOLN
500.0000 mL | Freq: Once | INTRAVENOUS | Status: AC
  Administered 2024-04-20: 500 mL via INTRAVENOUS

## 2024-04-20 MED ORDER — LIDOCAINE HCL (PF) 1 % IJ SOLN: 30.0000 mL | INTRAMUSCULAR | Status: DC | PRN

## 2024-04-20 MED ORDER — OXYTOCIN-SODIUM CHLORIDE 30-0.9 UT/500ML-% IV SOLN
2.5000 [IU]/h | INTRAVENOUS | Status: DC
  Administered 2024-04-21: 2.5 [IU]/h via INTRAVENOUS
  Filled 2024-04-20: qty 500

## 2024-04-20 MED ORDER — OXYTOCIN BOLUS FROM INFUSION
333.0000 mL | Freq: Once | INTRAVENOUS | Status: AC
  Administered 2024-04-21: 333 mL via INTRAVENOUS

## 2024-04-20 MED ORDER — LIDOCAINE HCL (PF) 1 % IJ SOLN
INTRAMUSCULAR | Status: DC | PRN
  Administered 2024-04-20: 8 mL via EPIDURAL

## 2024-04-20 MED ORDER — ACETAMINOPHEN 325 MG PO TABS: 650.0000 mg | ORAL_TABLET | ORAL | Status: DC | PRN

## 2024-04-20 MED ORDER — FENTANYL-BUPIVACAINE-NACL 0.5-0.125-0.9 MG/250ML-% EP SOLN: 12.0000 mL/h | EPIDURAL | Status: DC | PRN

## 2024-04-20 NOTE — Progress Notes (Signed)
 LABOR PROGRESS NOTE  Patient Name: Carly Singleton, female   DOB: Sep 04, 1996, 28 y.o.  MRN: [id number]  Patient starting to feel painful contractions. Moving around the room. Did feel some leaking when she went to the bathroom. Amenable to check and AROM if indicated. Cervix 4.5/80/-2. R/B/A of AROM discussed with patient, and verbal consent obtained. Babe's head found to be well-applied. Suspect high-leak; small bag palpated. Obtained  small amount of  clear fluid. Mom and babe tolerated well.  Cat I.  Maud Sorenson, MD

## 2024-04-20 NOTE — H&P (Signed)
 LABOR AND DELIVERY ADMISSION HISTORY AND PHYSICAL NOTE  Carly Singleton is a 28 y.o. female [phone] with IUP at [redacted]w[redacted]d presenting for IOL for LGA.   Patient reports the fetal movement as active. Patient reports uterine contraction activity as rare. Patient reports vaginal bleeding as none. Patient describes fluid per vagina as None.   Patient denies headache, vision changes, chest pain, shortness of breath, right upper quadrant pain, or LE edema.  She plans on breast feeding and bottle feeding. Her contraception plan is: Depo-Provera.  Prenatal History/Complications: PNC at [facility] - LGA - Anemia - Hx HSV - Hx child with down syndrome  Sono:  @[redacted]w[redacted]d , CWD, normal anatomy, cephalic presentation, posterior fundal placenta, 93%ile, AC >99%, EFW 3900 g  Pregnancy complications:  Patient Active Problem List   Diagnosis Date Noted   Large for dates affecting management of mother 04/20/2024   LGA (large for gestational age) fetus affecting management of mother 04/06/2024   History of poor fetal growth in prior pregnancy 03/02/2024   Anemia in pregnancy, third trimester 02/07/2024   Friable cervix 01/20/2024   H/O herpes simplex infection 12/02/2023   Down syndrome in child of prior pregnancy, currently pregnant 10/11/2023   Supervision of other normal pregnancy, antepartum 09/09/2022    Past Medical History: Past Medical History:  Diagnosis Date   Chlamydia    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    Past Surgical History: Past Surgical History:  Procedure Laterality Date   WISDOM TOOTH EXTRACTION Left 06/2022   two;    Obstetrical History: OB History     Gravida  6   Para  2   Term  2   Preterm      AB  3   Living  2      SAB  2   IAB  1   Ectopic      Multiple  0   Live Births  2           Social History: Social History   Socioeconomic History   Marital status: Single    Spouse name: Not on file   Number of children: 2    Years of education: 12   Highest education level: Not on file  Occupational History   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  Tobacco Use   Smoking status: Never   Smokeless tobacco: Never  Vaping Use   Vaping status: Never Used  Substance and Sexual Activity   Alcohol use: Not Currently    Comment: monthly   Drug use: Not Currently    Types: Marijuana    Comment: last 2021   Sexual activity: Yes    Partners: Male  Other Topics Concern   Not on file  Social History Narrative   Not on file   Social Drivers of Health   Financial Resource Strain: Low Risk  (09/09/2022)   Overall Financial Resource Strain (CARDIA)    Difficulty of Paying Living Expenses: Not hard at all  Food Insecurity: No Food Insecurity (04/20/2024)   Hunger Vital Sign    Worried About Running Out of Food in the Last Year: Never true    Ran Out of Food in the Last Year: Never true  Transportation Needs: No Transportation Needs (04/20/2024)   PRAPARE - Administrator, Civil Service (Medical): No    Lack of Transportation (Non-Medical): No  Physical Activity: Inactive (09/09/2022)   Exercise Vital Sign    Days of Exercise per  Week: 0 days    Minutes of Exercise per Session: 0 min  Stress: No Stress Concern Present (09/09/2022)   [employer] of Occupational Health - Occupational Stress Questionnaire    Feeling of Stress : Not at all  Social Connections: Unknown (09/09/2022)   Social Connection and Isolation Panel [NHANES]    Frequency of Communication with Friends and Family: More than three times a week    Frequency of Social Gatherings with Friends and Family: Twice a week    Attends Religious Services: Never    Database administrator or Organizations: No    Attends Engineer, structural: Never    Marital Status: Not on file    Family History: Family History  Problem Relation Age of Onset   Healthy Mother    Healthy Father    Cancer Maternal Grandmother  14       stomach   Cancer Maternal Grandfather 25       stomach   Healthy Paternal Grandmother    Other Paternal Grandfather        liver failure   Down syndrome Daughter    Healthy Son    Diabetes Neg Hx    Heart disease Neg Hx     Allergies: Allergies  Allergen Reactions   Azithromycin  Itching and Rash    Only seen with IV usage not po medication.   Penicillin G Rash    Medications Prior to Admission  Medication Sig Dispense Refill Last Dose/Taking   Ferric Maltol  30 MG CAPS Take 1 capsule (30 mg total) by mouth 2 (two) times daily. Please take one hour before breakfast and dinner 60 capsule 2 Past Week   Prenatal Vit-Fe Fumarate-FA (M-NATAL PLUS) 27-1 MG TABS TAKE 1 TABLET BY MOUTH ONCE DAILY 30 tablet 12 04/19/2024   valACYclovir  (VALTREX ) 1000 MG tablet Take 1 tablet (1,000 mg total) by mouth daily. 30 tablet 1 Past Month   acetaminophen -caffeine  (EXCEDRIN  TENSION HEADACHE) 500-65 MG TABS per tablet Take 2 tablets by mouth every 8 (eight) hours as needed. (Patient not taking: Reported on 03/31/2024) 90 tablet 0    diphenhydrAMINE  HCl, Sleep, 25 MG TBDP Take 1 tablet (25 mg total) by mouth at bedtime as needed (nausea). 30 tablet 0    metoCLOPramide  (REGLAN ) 5 MG tablet Take 1 tablet (5 mg total) by mouth every 8 (eight) hours as needed for up to 5 days for nausea or vomiting. 15 tablet 0    ondansetron  (ZOFRAN -ODT) 8 MG disintegrating tablet Take 1 tablet (8 mg total) by mouth every 8 (eight) hours as needed for nausea or vomiting. 30 tablet 0      Review of Systems  All systems reviewed and negative except as stated in HPI  Physical Exam BP 101/67   Pulse 94   Temp 98.7 F (37.1 C) (Oral)   Resp 16   Ht 5\' 1"  (1.549 m)   Wt 59.7 kg   LMP 07/01/2023   BMI 24.88 kg/m   Physical Exam Constitutional:      General: She is not in acute distress.    Appearance: She is not ill-appearing.  Cardiovascular:     Rate and Rhythm: Normal rate and regular rhythm.  Pulmonary:      Effort: Pulmonary effort is normal.  Abdominal:     Comments: Gravid  Genitourinary:    General: Normal vulva.     Rectum: Normal.  Musculoskeletal:        General: Normal range of motion.  Skin:  General: Skin is warm and dry.  Neurological:     General: No focal deficit present.  Psychiatric:        Mood and Affect: Mood normal.   Presentation: cephalic by check  Fetal monitoring: Baseline: 125 bpm, Variability: Good {> 6 bpm), Accelerations: Reactive, and Decelerations: Absent Uterine activity: rare  Dilation: 3 Effacement (%): 60 Station: Ballotable Presentation: Vertex Exam by:: Dr. Nolon Baxter  Prenatal labs: ABO, Rh: --/--/PENDING (04/24 1123) Antibody: PENDING (04/24 1123) Rubella: 2.48 (10/14 1600) RPR: Non Reactive (02/07 0910)  HBsAg: Negative (10/14 1600)  HIV: Non Reactive (02/07 0910)  GC/Chlamydia:  Neisseria Gonorrhea  Date Value Ref Range Status  03/31/2024 Negative  Final   Chlamydia  Date Value Ref Range Status  03/31/2024 Negative  Final   GBS: Negative/-- (04/04 0953)   Prenatal Transfer Tool  Maternal Diabetes: No Genetic Screening: Normal Maternal Ultrasounds/Referrals: Normal Fetal Ultrasounds or other Referrals:  None Maternal Substance Abuse:  No Significant Maternal Medications: PO iron , valtrex  Significant Maternal Lab Results: Group B Strep negative  Results for orders placed or performed during the hospital encounter of 04/20/24 (from the past 24 hours)  CBC   Collection Time: 04/20/24 11:12 AM  Result Value Ref Range   WBC 8.4 4.0 - 10.5 K/uL   RBC 3.85 (L) 3.87 - 5.11 MIL/uL   Hemoglobin 9.8 (L) 12.0 - 15.0 g/dL   HCT 40.9 (L) 81.1 - 91.4 %   MCV 79.2 (L) 80.0 - 100.0 fL   MCH 25.5 (L) 26.0 - 34.0 pg   MCHC 32.1 30.0 - 36.0 g/dL   RDW 78.2 (H) 95.6 - 21.3 %   Platelets 478 (H) 150 - 400 K/uL   nRBC 0.5 (H) 0.0 - 0.2 %  Type and screen   Collection Time: 04/20/24 11:23 AM  Result Value Ref Range   ABO/RH(D) PENDING     Antibody Screen PENDING    Sample Expiration      04/23/2024,2359 Performed at [facility], [address], [place], [place] [address]     Assessment: Carly Singleton is a 28 y.o. [id number] at [redacted]w[redacted]d here for IOL 2/2 LGA  #Labor: Will start with pitocin . AROM as able #Pain: IV pain meds PRN, epidural upon request #FHT: Category I #GBS/ID: Negative #MOF: breast feeding and bottle feeding #MOC: Depo-Provera  #Hx HSV: On valtrex . No lesions on exam and patient feels no prodromal symptoms of outbreak #Anemia: 9.8. Low threshold for TXA at delivery #LGA: pelvis proven to 3118 g. This babe EFW 3900 g. Shoulder precautions at delivery  Maud Sorenson, MD [facility] Center for [facility]  04/20/2024, 12:26 PM

## 2024-04-20 NOTE — Progress Notes (Signed)
 LABOR PROGRESS NOTE  Patient Name: Carly Singleton, female   DOB: Jun 15, 1996, 28 y.o.  MRN: [id number]  Pt with Cat 1 strip however ctx spacing.  Spoke with nursing and will continue to work circuits but will also restart pit.   Ebony Goldstein, MD

## 2024-04-20 NOTE — Anesthesia Procedure Notes (Signed)
 Epidural Patient location during procedure: OB Start time: 04/20/2024 10:01 PM End time: 04/20/2024 10:05 PM  Staffing Anesthesiologist: Rhenda Cedars, MD  Preanesthetic Checklist Completed: patient identified, IV checked, site marked, risks and benefits discussed, surgical consent, monitors and equipment checked, pre-op evaluation and timeout performed  Epidural Patient position: sitting Prep: DuraPrep and site prepped and draped Patient monitoring: continuous pulse ox and blood pressure Approach: midline Location: L4-L5 Injection technique: LOR air  Needle:  Needle type: Tuohy  Needle gauge: 17 G Needle length: 9 cm and 9 Needle insertion depth: 5 cm Catheter type: closed end flexible Catheter size: 19 Gauge Catheter at skin depth: 10 cm Test dose: negative  Assessment Events: blood not aspirated, no cerebrospinal fluid, injection not painful, no injection resistance, no paresthesia and negative IV test

## 2024-04-20 NOTE — Anesthesia Preprocedure Evaluation (Signed)
 Anesthesia Evaluation  Patient identified by MRN, date of birth, ID band Patient awake    Reviewed: Allergy & Precautions, H&P , NPO status , Patient's Chart, lab work & pertinent test results, reviewed documented beta blocker date and time   Airway Mallampati: I  TM Distance: >3 FB Neck ROM: full    Dental no notable dental hx.    Pulmonary neg pulmonary ROS   Pulmonary exam normal breath sounds clear to auscultation       Cardiovascular negative cardio ROS Normal cardiovascular exam Rhythm:regular Rate:Normal     Neuro/Psych  PSYCHIATRIC DISORDERS  Depression    negative neurological ROS  negative psych ROS   GI/Hepatic negative GI ROS, Neg liver ROS,,,  Endo/Other  negative endocrine ROS    Renal/GU negative Renal ROS  negative genitourinary   Musculoskeletal   Abdominal   Peds  Hematology negative hematology ROS (+) Blood dyscrasia, anemia   Anesthesia Other Findings   Reproductive/Obstetrics (+) Pregnancy                             Anesthesia Physical Anesthesia Plan  ASA: 2  Anesthesia Plan: Epidural   Post-op Pain Management: Minimal or no pain anticipated   Induction: Intravenous  PONV Risk Score and Plan: 2 and Treatment may vary due to age or medical condition  Airway Management Planned: Natural Airway  Additional Equipment: None  Intra-op Plan:   Post-operative Plan:   Informed Consent: I have reviewed the patients History and Physical, chart, labs and discussed the procedure including the risks, benefits and alternatives for the proposed anesthesia with the patient or authorized representative who has indicated his/her understanding and acceptance.     Dental Advisory Given  Plan Discussed with: Anesthesiologist  Anesthesia Plan Comments: (Labs checked- platelets confirmed with RN in room. Fetal heart tracing, per RN, reported to be stable enough for sitting  procedure. Discussed epidural, and patient consents to the procedure:  included risk of possible headache,backache, failed block, allergic reaction, and nerve injury. This patient was asked if she had any questions or concerns before the procedure started.)       Anesthesia Quick Evaluation

## 2024-04-21 ENCOUNTER — Encounter (HOSPITAL_COMMUNITY): Payer: Self-pay | Admitting: Family Medicine

## 2024-04-21 ENCOUNTER — Encounter: Payer: Medicaid Other | Admitting: Family Medicine

## 2024-04-21 DIAGNOSIS — O3663X0 Maternal care for excessive fetal growth, third trimester, not applicable or unspecified: Secondary | ICD-10-CM | POA: Diagnosis not present

## 2024-04-21 DIAGNOSIS — Z3A39 39 weeks gestation of pregnancy: Secondary | ICD-10-CM | POA: Diagnosis not present

## 2024-04-21 DIAGNOSIS — O134 Gestational [pregnancy-induced] hypertension without significant proteinuria, complicating childbirth: Secondary | ICD-10-CM | POA: Diagnosis not present

## 2024-04-21 LAB — CBC
HCT: 27.5 % — ABNORMAL LOW (ref 36.0–46.0)
Hemoglobin: 8.8 g/dL — ABNORMAL LOW (ref 12.0–15.0)
MCH: 25.5 pg — ABNORMAL LOW (ref 26.0–34.0)
MCHC: 32 g/dL (ref 30.0–36.0)
MCV: 79.7 fL — ABNORMAL LOW (ref 80.0–100.0)
Platelets: 395 10*3/uL (ref 150–400)
RBC: 3.45 MIL/uL — ABNORMAL LOW (ref 3.87–5.11)
RDW: 15.9 % — ABNORMAL HIGH (ref 11.5–15.5)
WBC: 15 10*3/uL — ABNORMAL HIGH (ref 4.0–10.5)
nRBC: 0.1 % (ref 0.0–0.2)

## 2024-04-21 LAB — RPR: RPR Ser Ql: NONREACTIVE

## 2024-04-21 MED ORDER — DIBUCAINE (PERIANAL) 1 % EX OINT: 1.0000 | TOPICAL_OINTMENT | CUTANEOUS | Status: DC | PRN

## 2024-04-21 MED ORDER — SODIUM CHLORIDE 0.9 % IV SOLN: 250.0000 mL | INTRAVENOUS | Status: DC | PRN

## 2024-04-21 MED ORDER — DIPHENHYDRAMINE HCL 25 MG PO CAPS: 25.0000 mg | ORAL_CAPSULE | Freq: Four times a day (QID) | ORAL | Status: DC | PRN

## 2024-04-21 MED ORDER — IBUPROFEN 600 MG PO TABS
600.0000 mg | ORAL_TABLET | Freq: Four times a day (QID) | ORAL | Status: DC
  Administered 2024-04-21 – 2024-04-22 (×6): 600 mg via ORAL
  Filled 2024-04-21 (×6): qty 1

## 2024-04-21 MED ORDER — SODIUM CHLORIDE 0.9% FLUSH
3.0000 mL | Freq: Two times a day (BID) | INTRAVENOUS | Status: DC
  Administered 2024-04-21: 3 mL via INTRAVENOUS

## 2024-04-21 MED ORDER — WITCH HAZEL-GLYCERIN EX PADS: 1.0000 | MEDICATED_PAD | CUTANEOUS | Status: DC | PRN

## 2024-04-21 MED ORDER — ZOLPIDEM TARTRATE 5 MG PO TABS: 5.0000 mg | ORAL_TABLET | Freq: Every evening | ORAL | Status: DC | PRN

## 2024-04-21 MED ORDER — MEDROXYPROGESTERONE ACETATE 150 MG/ML IM SUSP: 150.0000 mg | INTRAMUSCULAR | Status: DC | PRN

## 2024-04-21 MED ORDER — SENNOSIDES-DOCUSATE SODIUM 8.6-50 MG PO TABS
2.0000 | ORAL_TABLET | ORAL | Status: DC
  Administered 2024-04-21 – 2024-04-22 (×2): 2 via ORAL
  Filled 2024-04-21 (×2): qty 2

## 2024-04-21 MED ORDER — SIMETHICONE 80 MG PO CHEW: 80.0000 mg | CHEWABLE_TABLET | ORAL | Status: DC | PRN

## 2024-04-21 MED ORDER — ONDANSETRON HCL 4 MG/2ML IJ SOLN: 4.0000 mg | INTRAMUSCULAR | Status: DC | PRN

## 2024-04-21 MED ORDER — ONDANSETRON HCL 4 MG PO TABS
4.0000 mg | ORAL_TABLET | ORAL | Status: DC | PRN
  Administered 2024-04-21: 4 mg via ORAL
  Filled 2024-04-21: qty 1

## 2024-04-21 MED ORDER — COCONUT OIL OIL: 1.0000 | TOPICAL_OIL | Status: DC | PRN

## 2024-04-21 MED ORDER — TRANEXAMIC ACID-NACL 1000-0.7 MG/100ML-% IV SOLN
INTRAVENOUS | Status: AC
Start: 2024-04-21 — End: 2024-04-21
  Filled 2024-04-21: qty 100

## 2024-04-21 MED ORDER — PRENATAL MULTIVITAMIN CH
1.0000 | ORAL_TABLET | Freq: Every day | ORAL | Status: DC
  Administered 2024-04-21 – 2024-04-22 (×2): 1 via ORAL
  Filled 2024-04-21 (×2): qty 1

## 2024-04-21 MED ORDER — ACETAMINOPHEN 325 MG PO TABS
650.0000 mg | ORAL_TABLET | ORAL | Status: DC | PRN
  Administered 2024-04-22: 650 mg via ORAL
  Filled 2024-04-21: qty 2

## 2024-04-21 MED ORDER — BENZOCAINE-MENTHOL 20-0.5 % EX AERO: 1.0000 | INHALATION_SPRAY | CUTANEOUS | Status: DC | PRN

## 2024-04-21 MED ORDER — TRANEXAMIC ACID-NACL 1000-0.7 MG/100ML-% IV SOLN
1000.0000 mg | INTRAVENOUS | Status: AC
  Administered 2024-04-21: 1000 mg via INTRAVENOUS

## 2024-04-21 MED ORDER — SODIUM CHLORIDE 0.9% FLUSH: 3.0000 mL | INTRAVENOUS | Status: DC | PRN

## 2024-04-21 NOTE — Plan of Care (Signed)
  Problem: Education: Goal: Knowledge of Childbirth will improve Outcome: Adequate for Discharge Goal: Ability to make informed decisions regarding treatment and plan of care will improve Outcome: Adequate for Discharge Goal: Ability to state and carry out methods to decrease the pain will improve Outcome: Adequate for Discharge Goal: Individualized Educational Video(s) Outcome: Not Applicable   Problem: Coping: Goal: Ability to verbalize concerns and feelings about labor and delivery will improve Outcome: Completed/Met   Problem: Life Cycle: Goal: Ability to make normal progression through stages of labor will improve Outcome: Completed/Met Goal: Ability to effectively push during vaginal delivery will improve Outcome: Completed/Met   Problem: Life Cycle: Goal: Ability to make normal progression through stages of labor will improve Outcome: Completed/Met Goal: Ability to effectively push during vaginal delivery will improve Outcome: Completed/Met   Problem: Role Relationship: Goal: Will demonstrate positive interactions with the child Outcome: Adequate for Discharge   Problem: Safety: Goal: Risk of complications during labor and delivery will decrease Outcome: Adequate for Discharge   Problem: Pain Management: Goal: Relief or control of pain from uterine contractions will improve Outcome: Adequate for Discharge

## 2024-04-21 NOTE — Lactation Note (Addendum)
 This note was copied from a baby's chart. Lactation Consultation Note  Patient Name: Carly Singleton [id number] Date: 04/21/2024 Age:27 hours Reason for consult: Follow-up assessment  P3, Mother is experienced with breastfeeding. Baby demonstrated feeding cues.  Reviewed hand expression and provided mother with a manual pump fitted with 21 mm flange. Mother states she has had more difficulty latching on the R breast.  Had her hand express and prepump and baby latched with ease in football hold. Baby has good depth.  Intermittent swallows observed.  Maternal Data Has patient been taught Hand Expression?: Yes Does the patient have breastfeeding experience prior to this delivery?: Yes How long did the patient breastfeed?: one year  Feeding Mother's Current Feeding Choice: Breast Milk  LATCH Score Latch: Grasps breast easily, tongue down, lips flanged, rhythmical sucking.  Audible Swallowing: A few with stimulation  Type of Nipple: Everted at rest and after stimulation  Comfort (Breast/Nipple): Soft / non-tender  Hold (Positioning): Assistance needed to correctly position infant at breast and maintain latch.  LATCH Score: 8   Lactation Tools Discussed/Used    Interventions Interventions: Breast feeding basics reviewed;Assisted with latch;Skin to skin;Hand express;Hand pump;Education;LC Services brochure;CDC milk storage guidelines  Discharge Pump: Received Stork Pump  Consult Status Consult Status: Follow-up Date: 04/22/24 Follow-up type: In-patient    [facility] 04/21/2024, 12:27 PM

## 2024-04-21 NOTE — Discharge Summary (Signed)
 Postpartum Discharge Summary     Patient Name: Carly Singleton DOB: 1996-10-27 MRN: [id number]  Date of admission: 04/20/2024 Delivery date:04/21/2024 Delivering provider: CHUBB, CASEY C Date of discharge: 04/22/2024  Admitting diagnosis: Large for dates affecting management of mother [O36.60X0] Intrauterine pregnancy: [redacted]w[redacted]d     Secondary diagnosis:  Principal Problem:   Large for dates affecting management of mother Active Problems:   Supervision of other normal pregnancy, antepartum   Down syndrome in child of prior pregnancy, currently pregnant   H/O herpes simplex infection   Anemia in pregnancy, third trimester  Additional problems: None    Discharge diagnosis: Term Pregnancy Delivered and Anemia                                              Post partum procedures: None Augmentation: AROM and Pitocin  Complications: None  Hospital course: Induction of Labor With Vaginal Delivery   28 y.o. yo [id number] at [redacted]w[redacted]d was admitted to the hospital 04/20/2024 for induction of labor.  Indication for induction:  LGA .  Patient had an labor course that was uncomplicated. Membrane Rupture Time/Date: 4:21 PM,04/20/2024  Delivery Method:Vaginal, Spontaneous Operative Delivery:N/A Episiotomy: None Lacerations:  Periurethral;Labial Details of delivery can be found in separate delivery note.  Patient had a postpartum course that was uncomplicated. Patient is discharged home 04/22/24.  Newborn Data: Birth date:04/21/2024 Birth time:2:19 AM Gender:Female Living status:Living Apgars:9 ,9  Weight:   Magnesium Sulfate received: No BMZ received: No Rhophylac:N/A MMR:N/A T-DaP:Given prenatally Flu: No RSV Vaccine received: No Transfusion:No  Immunizations received: Immunization History  Administered Date(s) Administered   DTaP 03/07/1998, 04/29/1998, 07/24/1998, 04/04/1999   HIB (PRP-OMP) 03/07/1998, 04/29/1998, 07/24/1998   HPV Quadrivalent 05/28/2011, 05/24/2012, 05/26/2013    Hepatitis A 04/23/2008, 05/09/2009   Hepatitis B Apr [birth year], 1997, 02/05/1998, 07/24/1998   IPV 03/07/1998, 04/29/1998, 12/25/1998, 12/26/2001   Influenza,inj,Quad PF,6+ Mos 09/18/2015, 10/23/2016, 09/14/2017   Influenza-Unspecified 11/27/2013   MMR 12/25/1998, 12/26/2001   Meningococcal Conjugate 05/23/2010   Meningococcal polysaccharide vaccine (MPSV4) 02/11/2016   Tdap 09/18/2015, 03/04/2023, 03/31/2024   Varicella 12/25/1998, 05/28/2011    Physical exam  Vitals:   04/21/24 1418 04/21/24 1808 04/21/24 2039 04/22/24 0518  BP: 99/61 103/76 113/74 108/73  Pulse: 75 63 67 64  Resp:  16 16 18   Temp: 98.3 F (36.8 C) (!) 97.4 F (36.3 C) 98.3 F (36.8 C) 98.2 F (36.8 C)  TempSrc: Oral Oral Oral Oral  SpO2: 100% 100%  100%  Weight:      Height:       General: alert, cooperative, and no distress Lochia: appropriate Uterine Fundus: firm Incision: N/A DVT Evaluation: No evidence of DVT seen on physical exam. No cords or calf tenderness. No significant calf/ankle edema. Labs: Lab Results  Component Value Date   WBC 8.4 04/20/2024   HGB 9.8 (L) 04/20/2024   HCT 30.5 (L) 04/20/2024   MCV 79.2 (L) 04/20/2024   PLT 478 (H) 04/20/2024      Latest Ref Rng & Units 10/11/2023    4:00 PM  CMP  Glucose 70 - 99 mg/dL 68   BUN 6 - 20 mg/dL 7   Creatinine 1.30 - 8.65 mg/dL 7.84   Sodium 696 - 295 mmol/L 137   Potassium 3.5 - 5.2 mmol/L 4.0   Chloride 96 - 106 mmol/L 99   CO2 20 - 29 mmol/L 20  Calcium 8.7 - 10.2 mg/dL 9.7   Total Protein 6.0 - 8.5 g/dL 7.4   Total Bilirubin 0.0 - 1.2 mg/dL 0.5   Alkaline Phos 44 - 121 IU/L 61   AST 0 - 40 IU/L 13   ALT 0 - 32 IU/L 7    Edinburgh Score:    09/09/2022    9:33 AM  Edinburgh Postnatal Depression Scale Screening Tool  I have been able to laugh and see the funny side of things. 0  I have looked forward with enjoyment to things. 0  I have blamed myself unnecessarily when things went wrong. 0  I have been anxious or worried for no  good reason. 0  I have felt scared or panicky for no good reason. 0  Things have been getting on top of me. 0  I have been so unhappy that I have had difficulty sleeping. 0  I have felt sad or miserable. 0  I have been so unhappy that I have been crying. 0  The thought of harming myself has occurred to me. 0  Edinburgh Postnatal Depression Scale Total 0   No data recorded  After visit meds:  Allergies as of 04/22/2024       Reactions   Azithromycin  Itching, Rash   Only seen with IV usage not po medication.   Penicillin G Rash        Medication List     STOP taking these medications    diphenhydrAMINE  HCl (Sleep) 25 MG Tbdp   Excedrin  Tension Headache 500-65 MG Tabs per tablet Generic drug: acetaminophen -caffeine    metoCLOPramide  5 MG tablet Commonly known as: Reglan    ondansetron  8 MG disintegrating tablet Commonly known as: ZOFRAN -ODT       TAKE these medications    Ferric Maltol  30 MG Caps Take 1 capsule (30 mg total) by mouth 2 (two) times daily. Please take one hour before breakfast and dinner   ibuprofen  600 MG tablet Commonly known as: ADVIL  Take 1 tablet (600 mg total) by mouth every 6 (six) hours.   M-Natal Plus 27-1 MG Tabs TAKE 1 TABLET BY MOUTH ONCE DAILY   valACYclovir  1000 MG tablet Commonly known as: VALTREX  Take 1 tablet (1,000 mg total) by mouth daily.         Discharge home in stable condition Infant Feeding: Bottle and Breast Infant Disposition:home with mother Discharge instruction: per After Visit Summary and Postpartum booklet. Activity: Advance as tolerated. Pelvic rest for 6 weeks.  Diet: routine diet Future Appointments:No future appointments. Follow up Visit: Message to [facility] 4/25  Please schedule this patient for a In person postpartum visit in 4 weeks with the following provider: Any provider. Additional Postpartum F/U: n/a   Low risk pregnancy complicated by:  LGA, hx of Down Syndrome in prior preg, HSV,  Anemia Delivery mode:  Vaginal, Spontaneous Anticipated Birth Control:  Depo   04/21/2024 Ebony Goldstein, MD  Kraig Peru MSN, CNM Advanced Practice Provider, Center for [facility] 04/22/2024 6:43 AM

## 2024-04-22 MED ORDER — IBUPROFEN 600 MG PO TABS: 600.0000 mg | ORAL_TABLET | Freq: Four times a day (QID) | ORAL | 0 refills | Status: DC

## 2024-04-22 NOTE — Anesthesia Postprocedure Evaluation (Signed)
 Anesthesia Post Note  Patient: Carly Singleton  Procedure(s) Performed: AN AD HOC LABOR EPIDURAL     Patient location during evaluation: Mother Baby Anesthesia Type: Epidural Level of consciousness: awake and alert Pain management: pain level controlled Vital Signs Assessment: post-procedure vital signs reviewed and stable Respiratory status: spontaneous breathing, nonlabored ventilation and respiratory function stable Cardiovascular status: stable Postop Assessment: no headache, no backache, epidural receding and able to ambulate Anesthetic complications: no   No notable events documented.  Last Vitals:  Vitals:   04/21/24 2039 04/22/24 0518  BP: 113/74 108/73  Pulse: 67 64  Resp: 16 18  Temp: 36.8 C 36.8 C  SpO2:  100%    Last Pain:  Vitals:   04/22/24 0518  TempSrc: Oral  PainSc: 2    Pain Goal:                   [name]

## 2024-04-22 NOTE — Lactation Note (Signed)
 This note was copied from a baby's chart. Lactation Consultation Note  Patient Name: Boy Kriya Jan [id number] Date: 04/22/2024 Age:27 hours Reason for consult: Follow-up assessment;Term;Infant weight loss (4 % weight loss) Per mom baby had recently fed at the breast and supplemented. Per mom breast feeding is going well both breast, Denies soreness or engorgement. LC reviewed breast feeding D/C teaching and the Lc resources.  Waiting for the [facility] DEBP   Maternal Data Does the patient have breastfeeding experience prior to this delivery?: Yes  Feeding Mother's Current Feeding Choice: Breast Milk and Formula Nipple Type: Slow - flow  LATCH Score - 8     Lactation Tools Discussed/Used Tools: Pump;Flanges (mom had already received instructions on the hand pump) Flange Size: 24 Breast pump type: Manual Pump Education: Milk Storage;Setup, frequency, and cleaning  Interventions  Education   Discharge Discharge Education: Engorgement and breast care;Warning signs for feeding baby Pump: Manual;Personal;Referral sent for Stork Pump (per mom hadn't received her Stork DEBP, this LC called, and the representative confirmed not received. LC sent the referral to [facility] and waiting for arrival.)  Consult Status Consult Status: Complete Date: 04/22/24    Richarda Chance 04/22/2024, 1:12 PM

## 2024-04-22 NOTE — Progress Notes (Signed)
 Patient ID: Carly Singleton, female   DOB: July 09, 1996, 28 y.o.   MRN: [id number]  Post Partum Day One: S/P VD with repair of Right Periurethral laceration with extension to right labia.   Subjective: Patient up ad lib, denies syncope, but reports some dizziness. Reports consuming regular diet without issues and denies N/V. Denies issues with urination and reports bleeding is "still a little heavy."  Patient is breast and bottle feeding and reports going well.  Desires Depo for postpartum contraception.  Pain is being appropriately managed with use of ibuprofen  and tylenol .  Objective: Vitals:   04/21/24 1418 04/21/24 1808 04/21/24 2039 04/22/24 0518  BP: 99/61 103/76 113/74 108/73  Pulse: 75 63 67 64  Resp:  16 16 18   Temp: 98.3 F (36.8 C) (!) 97.4 F (36.3 C) 98.3 F (36.8 C) 98.2 F (36.8 C)  TempSrc: Oral Oral Oral Oral  SpO2: 100% 100%  100%  Weight:      Height:       Recent Labs    04/20/24 1112 04/21/24 0540  HGB 9.8* 8.8*  HCT 30.5* 27.5*    Physical Exam:  General: alert, cooperative, and no distress Mood/Affect: Appropriate/Appropriate Lungs: clear to auscultation, no wheezes, rales or rhonchi, symmetric air entry.  Heart: normal rate, regular rhythm, normal S1, S2, no murmurs, rubs, clicks or gallops, normal rate and regular rhythm. Breast: not examined. Abdomen:  + bowel sounds, Soft Uterine Fundus: firm, +3/U Lochia: appropriate Laceration: Not assessed Skin: Warm, Dry DVT Evaluation: No evidence of DVT seen on physical exam. No cords or calf tenderness. No significant calf/ankle edema.  Assessment S/P Vaginal Delivery-Day One Normal Involution Breast and Bottle Feeding Desires Depo Anemia  Plan: Patient desires discharge. Declines iron  infusion.  Discussed oral iron  and increasing in diet. Information placed in AVS. Rx sent. Give Depo prior to discharge Continue current care. L&D team to be updated on patient status.   Kraig Peru,  MSN, CNM 04/22/2024, 6:33 AM

## 2024-05-02 ENCOUNTER — Telehealth (HOSPITAL_COMMUNITY): Payer: Self-pay

## 2024-05-02 NOTE — Telephone Encounter (Signed)
 05/02/2024 1901  Name: Onella Prioleau MRN: [id number] DOB: 1996-04-04  Reason for Call:  Transition of Care Hospital Discharge Call  Contact Status: Patient Contact Status: Message  Language assistant needed:          Follow-Up Questions:    Dimple Francis Postnatal Depression Scale:  In the Past 7 Days:    PHQ2-9 Depression Scale:     Discharge Follow-up:    Post-discharge interventions: NA  Signature  Wadell Guild

## 2024-05-31 ENCOUNTER — Ambulatory Visit: Admitting: Obstetrics and Gynecology

## 2024-11-27 ENCOUNTER — Ambulatory Visit: Admitting: Family Medicine

## 2024-11-30 ENCOUNTER — Ambulatory Visit: Admitting: Family Medicine

## 2025-01-02 ENCOUNTER — Ambulatory Visit: Payer: Self-pay | Admitting: Licensed Clinical Social Worker

## 2025-01-24 NOTE — BH Specialist Note (Signed)
 A user error has taken place.

## 2025-01-30 ENCOUNTER — Ambulatory Visit: Payer: Self-pay

## 2025-01-30 NOTE — Telephone Encounter (Signed)
 FYI Only or Action Required?: FYI only for provider: appointment scheduled on 2/4.  Patient was last seen in primary care on 09/01/2023 by Gladis Elsie BROCKS, PA-C.  Called Nurse Triage reporting Breast Problem (Left Side).  Symptoms began about a month ago.  Interventions attempted: Rest, hydration, or home remedies.  Symptoms are: stable.  Triage Disposition: See PCP When Office is Open (Within 3 Days)  Patient/caregiver understands and will follow disposition?: Yes     Message from Amber H sent at 01/30/2025  1:41 PM EST  Reason for Triage: Patient c/o left breast tenderness/pain, has small pimples on nipple area, feels lumpy/swelling around arm pit area. Symptoms ongoing for 2 months now   Reason for Disposition  Change in shape or appearance of breast  Answer Assessment - Initial Assessment Questions 1. SYMPTOM: What's the main symptom you're concerned about?  (e.g., lump, nipple discharge, pain, rash)     Breast tenderness 2/10, one small pimple (acne appearance) near nipple, breast feels swollen (no localized lumps)  2. LOCATION: Where is the tenderness and swelling located?     Left breast and around nipple area;  more swollen on left side compared to right side, described as in the middle, bottom left corner extending toward arm pit, not into arm pit and is more in the breast tissue itself versus over rib cage  3. ONSET: When did symptoms  start?     One month ago  4. PRIOR HISTORY: Do you have any history of prior problems with your breasts? (e.g., breast cancer, breast implant, fibrocystic breast disease)     Denies;  Has been pregnant within the last year, breast fed from 03/2024 until around 07/2024. G6P3 per chart.   5. CAUSE: What do you think is causing this symptom?     Thought related to menstrual cycle, sometimes get tender but this is going on for weeks now and no longer feels related.  Wearing bras can make worse.   6. OTHER SYMPTOMS: Do you have  any other symptoms? (e.g., breast pain, fever, nipple discharge, redness or rash)     Denies any redness or discharge; denies fever or chills; denies rash; denies any worsening pain with deep breaths nor trouble breathing; reports recent brain fog, denies any dizziness or light-headed.   7. PREGNANCY-BREASTFEEDING: Is there any chance you are pregnant? When was your last menstrual period? Are you breastfeeding?     LMP last month, mid- month around 1/12  Protocols used: Breast Symptoms-A-AH

## 2025-01-31 ENCOUNTER — Encounter: Payer: Self-pay | Admitting: Family Medicine

## 2025-01-31 ENCOUNTER — Ambulatory Visit: Admitting: Family Medicine

## 2025-01-31 VITALS — BP 101/67 | HR 52 | Temp 98.0°F | Ht 61.0 in | Wt 131.0 lb

## 2025-01-31 DIAGNOSIS — N644 Mastodynia: Secondary | ICD-10-CM

## 2025-01-31 DIAGNOSIS — Z23 Encounter for immunization: Secondary | ICD-10-CM

## 2025-01-31 DIAGNOSIS — R102 Pelvic and perineal pain unspecified side: Secondary | ICD-10-CM

## 2025-01-31 MED ORDER — DOXYCYCLINE HYCLATE 100 MG PO TABS: 100.0000 mg | ORAL_TABLET | Freq: Two times a day (BID) | ORAL | 0 refills | Status: AC

## 2025-01-31 NOTE — Progress Notes (Unsigned)
 "  BP 101/67   Pulse (!) 52   Temp 98 F (36.7 C) (Oral)   Ht 5' 1 (1.549 m)   Wt 131 lb (59.4 kg)   LMP 01/08/2025 (Exact Date)   SpO2 98%   Breastfeeding No   BMI 24.75 kg/m    Subjective:    Patient ID: Carly Singleton, female    DOB: July 26, 1996, 29 y.o.   MRN: [id number]  HPI: Carly Singleton is a 29 y.o. female  Chief Complaint  Patient presents with   Breast Pain    Left breast. Onset about 2/3 months. Unable to wear regular bras. Has a bump or pimple on left nipple. Tender to touch. Stopped breast feeding the end of July 2025. Constant discomfort. Breast feels heavy.     Relevant past medical, surgical, family and social history reviewed and updated as indicated. Interim medical history since our last visit reviewed. Allergies and medications reviewed and updated.  Review of Systems  Constitutional: Negative.   Respiratory: Negative.    Cardiovascular: Negative.   Musculoskeletal: Negative.   Neurological: Negative.   Psychiatric/Behavioral: Negative.      Per HPI unless specifically indicated above     Objective:    BP 101/67   Pulse (!) 52   Temp 98 F (36.7 C) (Oral)   Ht 5' 1 (1.549 m)   Wt 131 lb (59.4 kg)   LMP 01/08/2025 (Exact Date)   SpO2 98%   Breastfeeding No   BMI 24.75 kg/m   Wt Readings from Last 3 Encounters:  01/31/25 131 lb (59.4 kg)  04/20/24 131 lb 11.2 oz (59.7 kg)  03/31/24 128 lb (58.1 kg)    Physical Exam Vitals and nursing note reviewed.  Constitutional:      General: She is not in acute distress.    Appearance: Normal appearance. She is not ill-appearing, toxic-appearing or diaphoretic.  HENT:     Head: Normocephalic and atraumatic.     Right Ear: External ear normal.     Left Ear: External ear normal.     Nose: Nose normal.     Mouth/Throat:     Mouth: Mucous membranes are moist.     Pharynx: Oropharynx is clear.  Eyes:     General: No scleral icterus.       Right eye: No discharge.        Left  eye: No discharge.     Extraocular Movements: Extraocular movements intact.     Conjunctiva/sclera: Conjunctivae normal.     Pupils: Pupils are equal, round, and reactive to light.  Cardiovascular:     Rate and Rhythm: Normal rate and regular rhythm.     Pulses: Normal pulses.     Heart sounds: Normal heart sounds. No murmur heard.    No friction rub. No gallop.  Pulmonary:     Effort: Pulmonary effort is normal. No respiratory distress.     Breath sounds: Normal breath sounds. No stridor. No wheezing, rhonchi or rales.  Chest:     Chest wall: No tenderness.  Musculoskeletal:        General: Normal range of motion.     Cervical back: Normal range of motion and neck supple.  Skin:    General: Skin is warm and dry.     Capillary Refill: Capillary refill takes less than 2 seconds.     Coloration: Skin is not jaundiced or pale.     Findings: No bruising, erythema, lesion or rash.     Comments: Inflammed  papillae on medial L breast, no palpable masses or redness or heat  Neurological:     General: No focal deficit present.     Mental Status: She is alert and oriented to person, place, and time. Mental status is at baseline.  Psychiatric:        Mood and Affect: Mood normal.        Behavior: Behavior normal.        Thought Content: Thought content normal.        Judgment: Judgment normal.     Results for orders placed or performed during the hospital encounter of 04/20/24  CBC   Collection Time: 04/20/24 11:12 AM  Result Value Ref Range   WBC 8.4 4.0 - 10.5 K/uL   RBC 3.85 (L) 3.87 - 5.11 MIL/uL   Hemoglobin 9.8 (L) 12.0 - 15.0 g/dL   HCT 69.4 (L) 63.9 - 53.9 %   MCV 79.2 (L) 80.0 - 100.0 fL   MCH 25.5 (L) 26.0 - 34.0 pg   MCHC 32.1 30.0 - 36.0 g/dL   RDW 84.0 (H) 88.4 - 84.4 %   Platelets 478 (H) 150 - 400 K/uL   nRBC 0.5 (H) 0.0 - 0.2 %  RPR   Collection Time: 04/20/24 11:12 AM  Result Value Ref Range   RPR Ser Ql NON REACTIVE NON REACTIVE  Type and screen    Collection Time: 04/20/24 11:23 AM  Result Value Ref Range   ABO/RH(D) O POS    Antibody Screen NEG    Sample Expiration      04/23/2024,2359 Performed at [facility], [address] [place], [place] [address]   CBC   Collection Time: 04/21/24  5:40 AM  Result Value Ref Range   WBC 15.0 (H) 4.0 - 10.5 K/uL   RBC 3.45 (L) 3.87 - 5.11 MIL/uL   Hemoglobin 8.8 (L) 12.0 - 15.0 g/dL   HCT 72.4 (L) 63.9 - 53.9 %   MCV 79.7 (L) 80.0 - 100.0 fL   MCH 25.5 (L) 26.0 - 34.0 pg   MCHC 32.0 30.0 - 36.0 g/dL   RDW 84.0 (H) 88.4 - 84.4 %   Platelets 395 150 - 400 K/uL   nRBC 0.1 0.0 - 0.2 %      Assessment & Plan:   Problem List Items Addressed This Visit   None Visit Diagnoses       Need for influenza vaccination    -  Primary   Relevant Orders   Flu vaccine trivalent PF, 6mos and older(Flulaval,Afluria,Fluarix,Fluzone)        Follow up plan: No follow-ups on file.      "

## 2025-02-01 ENCOUNTER — Inpatient Hospital Stay: Admission: RE | Admit: 2025-02-01 | Discharge: 2025-02-01 | Attending: Family Medicine

## 2025-02-01 ENCOUNTER — Ambulatory Visit

## 2025-02-01 DIAGNOSIS — N644 Mastodynia: Secondary | ICD-10-CM
